# Patient Record
Sex: Female | Born: 1960 | Race: White | Hispanic: No | Marital: Married | Smoking: Never smoker
Health system: Southern US, Community
[De-identification: ages and names within clinical notes are randomized; demographics above are authoritative.]

## PROBLEM LIST (undated history)

## (undated) DIAGNOSIS — G43109 Migraine with aura, not intractable, without status migrainosus: Secondary | ICD-10-CM

## (undated) DIAGNOSIS — G40909 Epilepsy, unspecified, not intractable, without status epilepticus: Secondary | ICD-10-CM

## (undated) HISTORY — DX: Migraine with aura, not intractable, without status migrainosus: G43.109

## (undated) HISTORY — DX: Epilepsy, unspecified, not intractable, without status epilepticus: G40.909

## (undated) HISTORY — PX: WISDOM TOOTH EXTRACTION: SHX21

---

## 2000-02-24 ENCOUNTER — Ambulatory Visit (HOSPITAL_COMMUNITY): Admission: RE | Admit: 2000-02-24 | Discharge: 2000-02-24 | Payer: Self-pay | Admitting: Family Medicine

## 2000-02-24 ENCOUNTER — Encounter: Payer: Self-pay | Admitting: Family Medicine

## 2001-11-08 ENCOUNTER — Other Ambulatory Visit: Admission: RE | Admit: 2001-11-08 | Discharge: 2001-11-08 | Payer: Self-pay | Admitting: Obstetrics and Gynecology

## 2004-03-11 ENCOUNTER — Other Ambulatory Visit: Admission: RE | Admit: 2004-03-11 | Discharge: 2004-03-11 | Payer: Self-pay | Admitting: Obstetrics and Gynecology

## 2005-03-14 ENCOUNTER — Other Ambulatory Visit: Admission: RE | Admit: 2005-03-14 | Discharge: 2005-03-14 | Payer: Self-pay | Admitting: Obstetrics and Gynecology

## 2006-03-30 ENCOUNTER — Other Ambulatory Visit: Admission: RE | Admit: 2006-03-30 | Discharge: 2006-03-30 | Payer: Self-pay | Admitting: Obstetrics & Gynecology

## 2007-04-01 ENCOUNTER — Other Ambulatory Visit: Admission: RE | Admit: 2007-04-01 | Discharge: 2007-04-01 | Payer: Self-pay | Admitting: Obstetrics and Gynecology

## 2008-04-07 ENCOUNTER — Other Ambulatory Visit: Admission: RE | Admit: 2008-04-07 | Discharge: 2008-04-07 | Payer: Self-pay | Admitting: Obstetrics and Gynecology

## 2010-11-15 ENCOUNTER — Other Ambulatory Visit: Payer: Self-pay | Admitting: Family Medicine

## 2010-11-15 DIAGNOSIS — R51 Headache: Secondary | ICD-10-CM

## 2010-11-21 ENCOUNTER — Ambulatory Visit
Admission: RE | Admit: 2010-11-21 | Discharge: 2010-11-21 | Disposition: A | Discharge status: Home / Self Care | Payer: 59 | Source: Ambulatory Visit | Attending: Family Medicine | Admitting: Family Medicine

## 2010-11-21 DIAGNOSIS — R51 Headache: Secondary | ICD-10-CM

## 2010-11-21 MED ORDER — GADOBENATE DIMEGLUMINE 529 MG/ML IV SOLN
11.0000 mL | Freq: Once | INTRAVENOUS | Status: AC | PRN
  Administered 2010-11-21: 11 mL via INTRAVENOUS

## 2012-06-06 LAB — HM PAP SMEAR

## 2012-07-24 LAB — HM MAMMOGRAPHY

## 2013-05-07 ENCOUNTER — Other Ambulatory Visit: Payer: Self-pay | Admitting: Nurse Practitioner

## 2013-05-07 NOTE — Telephone Encounter (Signed)
Annual Exam scheduled 05/2013

## 2013-06-11 ENCOUNTER — Encounter: Payer: Self-pay | Admitting: Nurse Practitioner

## 2013-06-12 ENCOUNTER — Encounter: Payer: Self-pay | Admitting: Nurse Practitioner

## 2013-06-12 ENCOUNTER — Ambulatory Visit (INDEPENDENT_AMBULATORY_CARE_PROVIDER_SITE_OTHER): Payer: Managed Care, Other (non HMO) | Admitting: Nurse Practitioner

## 2013-06-12 VITALS — BP 120/76 | HR 76 | Ht 62.25 in | Wt 124.0 lb

## 2013-06-12 DIAGNOSIS — E559 Vitamin D deficiency, unspecified: Secondary | ICD-10-CM

## 2013-06-12 DIAGNOSIS — Z01419 Encounter for gynecological examination (general) (routine) without abnormal findings: Secondary | ICD-10-CM

## 2013-06-12 DIAGNOSIS — R8290 Unspecified abnormal findings in urine: Secondary | ICD-10-CM

## 2013-06-12 DIAGNOSIS — N912 Amenorrhea, unspecified: Secondary | ICD-10-CM

## 2013-06-12 DIAGNOSIS — R82998 Other abnormal findings in urine: Secondary | ICD-10-CM

## 2013-06-12 DIAGNOSIS — Z0000 Encounter for general adult medical examination without abnormal findings: Secondary | ICD-10-CM

## 2013-06-12 LAB — POCT URINALYSIS DIPSTICK
Bilirubin, UA: NEGATIVE
Glucose, UA: NEGATIVE
Ketones, UA: NEGATIVE
Nitrite, UA: NEGATIVE
Urobilinogen, UA: NEGATIVE
pH, UA: 5

## 2013-06-12 LAB — TSH: TSH: 1.619 u[IU]/mL (ref 0.350–4.500)

## 2013-06-12 LAB — HEMOGLOBIN, FINGERSTICK: Hemoglobin, fingerstick: 13.5 g/dL (ref 12.0–16.0)

## 2013-06-12 MED ORDER — NORETHINDRONE 0.35 MG PO TABS: ORAL_TABLET | ORAL | Status: DC

## 2013-06-12 MED ORDER — ESTRADIOL 10 MCG VA TABS: 1.0000 | ORAL_TABLET | VAGINAL | Status: DC

## 2013-06-12 MED ORDER — VITAMIN D (ERGOCALCIFEROL) 1.25 MG (50000 UNIT) PO CAPS: 50000.0000 [IU] | ORAL_CAPSULE | ORAL | Status: DC

## 2013-06-12 NOTE — Progress Notes (Signed)
Reviewed personally.  M. Suzanne Ruffin Lada, MD.  

## 2013-06-12 NOTE — Progress Notes (Signed)
Patient ID: Betty DiegoLisa P York, female   DOB: 05/18/1960, 53 y.o.   MRN: [id number] 53 y.o. [id number] Married Caucasian Fe here for annual exam.  Amenorrhea X 3 years on POP.  Currently having muscle tension headaches and followed by her neurologist.  No LMP recorded. Patient is not currently having periods (Reason: Oral contraceptives).          Sexually active: yes  The current method of family planning is oral progesterone-only contraceptive.    Exercising: yes  Home exercise routine includes yoga and walking 4-5 days per week.  Yoga 1-2 times per week.. Smoker:  no  Health Maintenance: Pap:  06/06/12, WNL (very atrophic) MMG:  07/24/12, Bi-Rads 2: benign findings, repeat in one year Colonoscopy:  10/04/12, normal, repeat in 10 years BMD:  never TDaP:  05/23/10 Labs:  HB: 13.5  Urine: trace blood, 1+ leuks, trace protein   reports that she has never smoked. She has never used smokeless tobacco. She reports that she does not drink alcohol or use illicit drugs.  Past Medical History  Diagnosis Date  . Seizure disorder     Past Surgical History  Procedure Laterality Date  . Cesarean section  1990  . Wisdom tooth extraction  age 53    Current Outpatient Prescriptions  Medication Sig Dispense Refill  . Calcium Carbonate-Vit D-Min (CALCIUM 1200 PO) Take 1,200 mg by mouth daily.      . cyclobenzaprine (FLEXERIL) 5 MG tablet as directed.      . Estradiol (VAGIFEM) 10 MCG TABS vaginal tablet Place 1 tablet (10 mcg total) vaginally 3 (three) times a week.  24 tablet  3  . norethindrone (MICRONOR,CAMILA,ERRIN) 0.35 MG tablet TAKE 1 TABLET EVERY DAY  84 tablet  3  . predniSONE (DELTASONE) 10 MG tablet as directed.      . Vitamin D, Ergocalciferol, (DRISDOL) 50000 UNITS CAPS capsule Take 1 capsule (50,000 Units total) by mouth every 7 (seven) days.  30 capsule  3   No current facility-administered medications for this visit.    Family History  Problem Relation Age of Onset  . Hypertension Mother    . Hypertension Father     ROS:  Pertinent items are noted in HPI.  Otherwise, a comprehensive ROS was negative.  Exam:   BP 120/76  Pulse 76  Ht 5' 2.25" (1.581 m)  Wt 124 lb (56.246 kg)  BMI 22.50 kg/m2 Height: 5' 2.25" (158.1 cm)  Ht Readings from Last 3 Encounters:  06/12/13 5' 2.25" (1.581 m)    General appearance: alert, cooperative and appears stated age Head: Normocephalic, without obvious abnormality, atraumatic Neck: no adenopathy, supple, symmetrical, trachea midline and thyroid normal to inspection and palpation Lungs: clear to auscultation bilaterally Breasts: normal appearance, no masses or tenderness Heart: regular rate and rhythm Abdomen: soft, non-tender; no masses,  no organomegaly Extremities: extremities normal, atraumatic, no cyanosis or edema Skin: Skin color, texture, turgor normal. No rashes or lesions Lymph nodes: Cervical, supraclavicular, and axillary nodes normal. No abnormal inguinal nodes palpated Neurologic: Grossly normal   Pelvic: External genitalia:  no lesions              Urethra:  normal appearing urethra with no masses, tenderness or lesions              Bartholin's and Skene's: normal                 Vagina: normal appearing vagina with normal color and discharge, no lesions  Cervix: anteverted              Pap taken: no Bimanual Exam:  Uterus:  normal size, contour, position, consistency, mobility, non-tender              Adnexa: no mass, fullness, tenderness               Rectovaginal: Confirms               Anus:  normal sphincter tone, no lesions  A:  Well Woman with normal exam  POP for birth control with amenorrhea  History of atrophic vaginitis  R/O UTI - asymptomatic  P:   Pap smear as per guidelines   Mammogram due 3/15  Will Vit D and will follow with labs   Refill POP but will [facility] with [facility] results  Counseled on breast self exam, mammography screening, adequate intake of calcium and vitamin D, diet and  exercise return annually or prn  An After Visit Summary was printed and given to the patient.

## 2013-06-12 NOTE — Patient Instructions (Signed)

## 2013-06-13 LAB — URINE CULTURE
Colony Count: NO GROWTH
Organism ID, Bacteria: NO GROWTH

## 2013-06-13 LAB — FOLLICLE STIMULATING HORMONE: FSH: 111.9 m[IU]/mL

## 2013-06-13 LAB — VITAMIN D 25 HYDROXY (VIT D DEFICIENCY, FRACTURES): Vit D, 25-Hydroxy: 16 ng/mL — ABNORMAL LOW (ref 30–89)

## 2013-06-16 ENCOUNTER — Ambulatory Visit: Payer: Managed Care, Other (non HMO) | Admitting: Nurse Practitioner

## 2013-09-08 ENCOUNTER — Ambulatory Visit (INDEPENDENT_AMBULATORY_CARE_PROVIDER_SITE_OTHER): Payer: Managed Care, Other (non HMO) | Admitting: *Deleted

## 2013-09-08 DIAGNOSIS — E559 Vitamin D deficiency, unspecified: Secondary | ICD-10-CM

## 2013-09-08 NOTE — Addendum Note (Signed)
Addended by: Luisa DagoPHILLIPS, STEPHANIE C on: 09/08/2013 01:38 PM   Modules accepted: Orders

## 2013-09-09 ENCOUNTER — Other Ambulatory Visit: Payer: Self-pay

## 2013-09-09 LAB — VITAMIN D 25 HYDROXY (VIT D DEFICIENCY, FRACTURES): Vit D, 25-Hydroxy: 33 ng/mL (ref 30–89)

## 2013-09-10 ENCOUNTER — Telehealth: Payer: Self-pay | Admitting: *Deleted

## 2013-09-10 NOTE — Telephone Encounter (Signed)
Patient is calling stephanie back °

## 2013-09-10 NOTE — Telephone Encounter (Signed)
Message copied by Luisa DagoPHILLIPS, STEPHANIE C on Wed Sep 10, 2013 10:58 AM ------      Message from: GRUBB, Elease HashimotoPATRICIA R      Created: Tue Sep 09, 2013  8:41 AM       Good news Vit D has gone from 9316 to 5333 continue with Vit D per protocol. ------

## 2013-09-10 NOTE — Telephone Encounter (Signed)
I have attempted to contact this patient by phone with the following results: left message to return my call on answering machine (mobile per DPR).  

## 2013-09-11 NOTE — Telephone Encounter (Signed)
Pt notified in result note. Med list updated.

## 2013-09-11 NOTE — Telephone Encounter (Signed)
Patient is calling stephanie back °

## 2014-03-16 ENCOUNTER — Encounter: Payer: Self-pay | Admitting: Nurse Practitioner

## 2014-06-18 ENCOUNTER — Ambulatory Visit (INDEPENDENT_AMBULATORY_CARE_PROVIDER_SITE_OTHER): Payer: Managed Care, Other (non HMO) | Admitting: Nurse Practitioner

## 2014-06-18 ENCOUNTER — Encounter: Payer: Self-pay | Admitting: Nurse Practitioner

## 2014-06-18 VITALS — BP 100/66 | HR 64 | Ht 62.25 in | Wt 125.0 lb

## 2014-06-18 DIAGNOSIS — Z0000 Encounter for general adult medical examination without abnormal findings: Secondary | ICD-10-CM

## 2014-06-18 DIAGNOSIS — R8290 Unspecified abnormal findings in urine: Secondary | ICD-10-CM

## 2014-06-18 DIAGNOSIS — M5481 Occipital neuralgia: Secondary | ICD-10-CM

## 2014-06-18 DIAGNOSIS — Z01419 Encounter for gynecological examination (general) (routine) without abnormal findings: Secondary | ICD-10-CM

## 2014-06-18 DIAGNOSIS — E559 Vitamin D deficiency, unspecified: Secondary | ICD-10-CM

## 2014-06-18 LAB — POCT URINALYSIS DIPSTICK
Bilirubin, UA: NEGATIVE
Blood, UA: NEGATIVE
Glucose, UA: NEGATIVE
Ketones, UA: NEGATIVE
Nitrite, UA: NEGATIVE
Protein, UA: NEGATIVE
Urobilinogen, UA: NEGATIVE
pH, UA: 6

## 2014-06-18 MED ORDER — ESTRADIOL 10 MCG VA TABS: 1.0000 | ORAL_TABLET | VAGINAL | Status: DC

## 2014-06-18 NOTE — Patient Instructions (Signed)

## 2014-06-18 NOTE — Progress Notes (Signed)
Patient ID: Betty York, female   DOB: 1961/03/17, 54 y.o.   MRN: [id number] 54 y.o. G9P1021 Married  Caucasian Fe here for annual exam.  She is now off POP since last January with a FSH of 119.2.   No vaginal bleeding or spotting.  On Neurontin for Occipital Neuralgia. She sees neurologist and has had trigger point injections initially.   Still vaginal dryness and still on Vagifem.  Son has moved to [place] and will be getting married in June.  Patient's last menstrual period was 11/12/2008.   Pt was on progestin only pill, now off menopausal.       Sexually active: Yes.    The current method of family planning is post menopausal status.    Exercising: Yes.    yoga and walking Smoker:  no  Health Maintenance: Pap:  06/06/12, negative (very atrophic) MMG:  07/29/13, Bi-Rads 2:  Benign findings  Colonoscopy:  5//3/14, polyp, repeat in 10 years TDaP:  05/23/10 Labs:  HB:  13.4  Urine:  1+ leuk's   reports that she has never smoked. She has never used smokeless tobacco. She reports that she does not drink alcohol or use illicit drugs.  Past Medical History  Diagnosis Date  . Seizure disorder     Past Surgical History  Procedure Laterality Date  . Cesarean section  1990  . Wisdom tooth extraction  age 75    Current Outpatient Prescriptions  Medication Sig Dispense Refill  . Estradiol (VAGIFEM) 10 MCG TABS vaginal tablet Place 1 tablet (10 mcg total) vaginally 3 (three) times a week. 24 tablet 3  . gabapentin (NEURONTIN) 600 MG tablet Take 1.5 tablets by mouth daily.  3   No current facility-administered medications for this visit.    Family History  Problem Relation Age of Onset  . Hypertension Mother   . Hypertension Father     ROS:  Pertinent items are noted in HPI.  Otherwise, a comprehensive ROS was negative.  Exam:   BP 100/66 mmHg  Pulse 64  Ht 5' 2.25" (1.581 m)  Wt 125 lb (56.7 kg)  BMI 22.68 kg/m2  LMP 11/12/2008 Height: 5' 2.25" (158.1 cm) Ht Readings from Last 3  Encounters:  06/18/14 5' 2.25" (1.581 m)  06/12/13 5' 2.25" (1.581 m)    General appearance: alert, cooperative and appears stated age Head: Normocephalic, without obvious abnormality, atraumatic Neck: no adenopathy, supple, symmetrical, trachea midline and thyroid normal to inspection and palpation Lungs: clear to auscultation bilaterally Breasts: normal appearance, no masses or tenderness Heart: regular rate and rhythm Abdomen: soft, non-tender; no masses,  no organomegaly Extremities: extremities normal, atraumatic, no cyanosis or edema Skin: Skin color, texture, turgor normal. No rashes or lesions Lymph nodes: Cervical, supraclavicular, and axillary nodes normal. No abnormal inguinal nodes palpated Neurologic: Grossly normal   Pelvic: External genitalia:  no lesions              Urethra:  normal appearing urethra with no masses, tenderness or lesions              Bartholin's and Skene's: normal                 Vagina: atrophic appearing vagina with normal color and discharge, no lesions              Cervix: anteverted              Pap taken: Yes.   Bimanual Exam:  Uterus:  normal size, contour, position,  consistency, mobility, non-tender              Adnexa: no mass, fullness, tenderness               Rectovaginal: Confirms               Anus:  normal sphincter tone, no lesions  Chaperone present:  no  A:  Well Woman with normal exam  Menopausal History of atrophic vaginitis - better on Vagifem R/O UTI - asymptomatic  Vit d deficiency  History of Occipital Neuralgia   P:   Reviewed health and wellness pertinent to exam  Pap smear taken today  Mammogram is due 07/2014  Refill on Vagifem for a year  Counseled with risk of CVA, DVT, cancer  Recheck Vit D and follow  Counseled on breast self exam, mammography screening, use and side effects of HRT, adequate intake of calcium and vitamin D, diet and exercise return annually or prn  An After Visit  Summary was printed and given to the patient.

## 2014-06-19 LAB — VITAMIN D 25 HYDROXY (VIT D DEFICIENCY, FRACTURES): Vit D, 25-Hydroxy: 15 ng/mL — ABNORMAL LOW (ref 30–100)

## 2014-06-19 LAB — HEMOGLOBIN, FINGERSTICK: Hemoglobin, fingerstick: 13.4 g/dL (ref 12.0–16.0)

## 2014-06-20 LAB — URINE CULTURE: Colony Count: 10000

## 2014-06-21 NOTE — Progress Notes (Signed)
Encounter reviewed by Dr. Mikaele Stecher Silva.  

## 2014-06-22 MED ORDER — VITAMIN D (ERGOCALCIFEROL) 1.25 MG (50000 UNIT) PO CAPS: 50000.0000 [IU] | ORAL_CAPSULE | ORAL | Status: DC

## 2014-06-22 NOTE — Addendum Note (Signed)
Addended by: Luisa DagoPHILLIPS, STEPHANIE C on: 06/22/2014 04:32 PM   Modules accepted: Orders, SmartSet

## 2014-06-26 LAB — IPS PAP TEST WITH HPV

## 2014-09-14 ENCOUNTER — Other Ambulatory Visit: Payer: Self-pay

## 2014-09-22 ENCOUNTER — Other Ambulatory Visit: Payer: Self-pay

## 2014-10-07 ENCOUNTER — Telehealth: Payer: Self-pay | Admitting: *Deleted

## 2014-10-07 NOTE — Telephone Encounter (Signed)
-----   Message from Ria CommentPatricia Grubb, FNP sent at 10/02/2014  8:15 AM EDT ----- Can you call and reschedule apt.  ----- Message -----    From: SYSTEM    Sent: 10/02/2014  12:05 AM      To: Ria CommentPatricia Grubb, FNP

## 2014-10-07 NOTE — Telephone Encounter (Signed)
I have attempted to contact this patient by phone with the following results: left message to return my call on answering machine ([facility]  [phone] (Home) RE:  Pt need lab appt for repeat Vit D.

## 2015-06-22 ENCOUNTER — Ambulatory Visit: Payer: Managed Care, Other (non HMO) | Admitting: Nurse Practitioner

## 2015-07-01 NOTE — Telephone Encounter (Signed)
Pt has AEX 07/23/15.  Will discuss at that time. Closing encounter.

## 2015-07-23 ENCOUNTER — Ambulatory Visit (INDEPENDENT_AMBULATORY_CARE_PROVIDER_SITE_OTHER): Payer: Managed Care, Other (non HMO) | Admitting: Nurse Practitioner

## 2015-07-23 ENCOUNTER — Encounter: Payer: Self-pay | Admitting: Nurse Practitioner

## 2015-07-23 VITALS — BP 108/66 | HR 64 | Ht 62.0 in | Wt 122.0 lb

## 2015-07-23 DIAGNOSIS — N852 Hypertrophy of uterus: Secondary | ICD-10-CM | POA: Diagnosis not present

## 2015-07-23 DIAGNOSIS — E559 Vitamin D deficiency, unspecified: Secondary | ICD-10-CM | POA: Diagnosis not present

## 2015-07-23 DIAGNOSIS — Z0000 Encounter for general adult medical examination without abnormal findings: Secondary | ICD-10-CM

## 2015-07-23 DIAGNOSIS — Z01419 Encounter for gynecological examination (general) (routine) without abnormal findings: Secondary | ICD-10-CM | POA: Diagnosis not present

## 2015-07-23 LAB — VITAMIN D 25 HYDROXY (VIT D DEFICIENCY, FRACTURES): Vit D, 25-Hydroxy: 19 ng/mL — ABNORMAL LOW (ref 30–100)

## 2015-07-23 LAB — HEMOGLOBIN, FINGERSTICK: Hemoglobin, fingerstick: 13.1 g/dL (ref 12.0–16.0)

## 2015-07-23 LAB — POCT URINALYSIS DIPSTICK
Bilirubin, UA: NEGATIVE
Blood, UA: NEGATIVE
Glucose, UA: NEGATIVE
Ketones, UA: NEGATIVE
Leukocytes, UA: NEGATIVE
Nitrite, UA: NEGATIVE
Protein, UA: NEGATIVE
Urobilinogen, UA: NEGATIVE
pH, UA: 7

## 2015-07-23 LAB — HIV ANTIBODY (ROUTINE TESTING W REFLEX): HIV 1&2 Ab, 4th Generation: NONREACTIVE

## 2015-07-23 MED ORDER — ESTRADIOL 10 MCG VA TABS: ORAL_TABLET | VAGINAL | Status: DC

## 2015-07-23 NOTE — Progress Notes (Signed)
Patient ID: Betty York, female   DOB: 09/03/1960, 55 y.o.   MRN: [id number]  [age] y.o. 653P1021 Married  Caucasian Fe here for annual exam.  She is feeling well.  She finds that if she takes Vit D RX the next day will have a migraine HA.  She has been under a lot of stress this past year.  She had shingles top of thigh fall 2016 with residual scars but no herpetic neuralgia.  She takes Gabapentin for other occipital neuralgia.  Patient's last menstrual period was 11/12/2008 (approximate).          Sexually active: Yes.    The current method of family planning is post menopausal status.    Exercising: Yes.    Home exercise routine includes yoga and walking. Smoker:  no  Health Maintenance: Pap: 06/18/14, Negative with neg HR HPV MMG: 10/14/14, Bi-Rads 1: Negative  Colonoscopy:  10/04/12, Dr. Loreta AveMann? Normal repeat in 10 yrs TDaP:  05/23/10 Hep C and HIV: done today Labs: HB: 13.1  Urine: Negative   reports that she has never smoked. She has never used smokeless tobacco. She reports that she does not drink alcohol or use illicit drugs.  Past Medical History  Diagnosis Date  . Seizure disorder [facility](HCC)     Past Surgical History  Procedure Laterality Date  . Cesarean section  1990  . Wisdom tooth extraction  age 55    Current Outpatient Prescriptions  Medication Sig Dispense Refill  . Estradiol (VAGIFEM) 10 MCG TABS vaginal tablet Use 1 tablet nightly X 14, then use three times a week 20 tablet 12  . gabapentin (NEURONTIN) 600 MG tablet Take 1.5 tablets by mouth daily.  3  . Vitamin D, Ergocalciferol, (DRISDOL) 50000 UNITS CAPS capsule Take 1 capsule (50,000 Units total) by mouth every 7 (seven) days. (Patient not taking: Reported on 07/23/2015) 30 capsule 1   No current facility-administered medications for this visit.    Family History  Problem Relation Age of Onset  . Hypertension Mother   . Hypertension Father     ROS:  Pertinent items are noted in HPI.  Otherwise, a comprehensive ROS  was negative.  Exam:   BP 108/66 mmHg  Pulse 64  Ht 5\' 2"  (1.575 m)  Wt 122 lb (55.339 kg)  BMI 22.31 kg/m2  LMP 11/12/2008 (Approximate) Height: 5\' 2"  (157.5 cm) Ht Readings from Last 3 Encounters:  07/23/15 5\' 2"  (1.575 m)  06/18/14 5' 2.25" (1.581 m)  06/12/13 5' 2.25" (1.581 m)    General appearance: alert, cooperative and appears stated age Head: Normocephalic, without obvious abnormality, atraumatic Neck: no adenopathy, supple, symmetrical, trachea midline and thyroid normal to inspection and palpation Lungs: clear to auscultation bilaterally Breasts: normal appearance, no masses or tenderness Heart: regular rate and rhythm Abdomen: soft, non-tender; no masses,  no organomegaly Extremities: extremities normal, atraumatic, no cyanosis or edema Skin: Skin color, texture, turgor normal. No rashes or lesions Lymph nodes: Cervical, supraclavicular, and axillary nodes normal. No abnormal inguinal nodes palpated Neurologic: Grossly normal   Pelvic: External genitalia:  no lesions              Urethra:  normal appearing urethra with no masses, tenderness or lesions              Bartholin's and Skene's: normal                 Vagina:very atrophic appearing vagina with normal color and discharge, no lesions  Cervix: anteverted              Pap taken: No. Bimanual Exam:  Uterus:  normal size, contour, position, consistency, mobility, non-tender and mass possible mass or stool on the right.              Adnexa: positive for: fullness that is most likely stool               Rectovaginal: Confirms               Anus:  normal sphincter tone, no lesions  Chaperone present: no  A:  Well Woman with normal exam  Menopausal History of atrophic vaginitis - currently off Vagifem secondary to cost Vit d deficiency History of Occipital Neuralgia  History of shingles top of thigh fall 2016  ? Uterine enlargement on the right vs stool in the  colon  P:   Reviewed health and wellness pertinent to exam  Pap smear as above  Mammogram is due 10/2015  Refill on Vagifem since now generic at HS X 14 then three times a week.  Counseled on risk of CVA, DVT, cancer,etc  Will follow with labs  Will plan on patient taking a stool softener and recheck again on Monday  Counseled on breast self exam, mammography screening, use and side effects of HRT, adequate intake of calcium and vitamin D, diet and exercise return annually or prn  An After Visit Summary was printed and given to the patient.

## 2015-07-23 NOTE — Patient Instructions (Signed)

## 2015-07-24 LAB — HEPATITIS C ANTIBODY: HCV Ab: NEGATIVE

## 2015-07-25 NOTE — Progress Notes (Signed)
Encounter reviewed by Dr. Brook Amundson C. Silva.  

## 2015-07-26 ENCOUNTER — Ambulatory Visit (INDEPENDENT_AMBULATORY_CARE_PROVIDER_SITE_OTHER): Payer: Managed Care, Other (non HMO) | Admitting: Nurse Practitioner

## 2015-07-26 ENCOUNTER — Encounter: Payer: Self-pay | Admitting: Nurse Practitioner

## 2015-07-26 VITALS — BP 118/70 | HR 72 | Resp 14 | Wt 126.0 lb

## 2015-07-26 DIAGNOSIS — R1900 Intra-abdominal and pelvic swelling, mass and lump, unspecified site: Secondary | ICD-10-CM

## 2015-07-26 NOTE — Progress Notes (Signed)
55 y.o. Married Caucasian female [id number] here for follow up of possible mass right side of uterus vs. Stool in the colon.  This was noted on Friday during her annual exam.  Since then she has taken stool softeners and has had good BM's.  Denies any symptoms of abdominal pain, N/V, or other GI symptoms.    O: Healthy WD,WN female Affect: normal Skin: warm and dry Abdomen:soft, non tender, normal bowel sounds Pelvic exam:EXTERNAL GENITALIA: normal appearing vulva with no masses, tenderness or lesions.  Normal discharge.  Uterus without enlargement or irregularity on the right side as noted on exam last Friday.  No adnexal mass.  A: Pelvic mass Resolved   Most likely stool in the colon    P:  Discussed findings and no other intervention is needed   Labs :   none  Instructions given regarding: AEX in 1 year  RV

## 2015-07-27 NOTE — Progress Notes (Signed)
Encounter reviewed by Dr. Brook Amundson C. Silva.  

## 2015-09-07 ENCOUNTER — Other Ambulatory Visit: Payer: Self-pay | Admitting: *Deleted

## 2015-09-07 MED ORDER — ESTRADIOL 10 MCG VA TABS: ORAL_TABLET | VAGINAL | Status: DC

## 2015-09-07 NOTE — Telephone Encounter (Signed)
Pharmacy sent fax requesting RX to be changed to 90 supply.

## 2015-11-22 ENCOUNTER — Encounter: Payer: Self-pay | Admitting: Nurse Practitioner

## 2016-07-31 ENCOUNTER — Ambulatory Visit: Payer: Managed Care, Other (non HMO) | Admitting: Nurse Practitioner

## 2016-08-01 ENCOUNTER — Ambulatory Visit: Payer: Managed Care, Other (non HMO) | Admitting: Nurse Practitioner

## 2016-08-09 ENCOUNTER — Ambulatory Visit: Payer: Managed Care, Other (non HMO) | Admitting: Nurse Practitioner

## 2016-08-30 ENCOUNTER — Ambulatory Visit: Payer: Managed Care, Other (non HMO) | Admitting: Nurse Practitioner

## 2016-10-06 NOTE — Progress Notes (Signed)
Patient ID: Betty York, female   DOB: 03/13/1961, 56 y.o.   MRN: [id number]  56 y.o. [id number] Married  Caucasian Fe here for annual exam.  postmenopausal and doing well with Vagifem.  She finds that if she takes Vit D RX the next day will have a migraine HA.= Vit D was 19 and 15 past 2 yrs.  She did try the OTC 1000 IU but not taking that now.  Does not recall if that gave her a headache as bad or any at all.  She is at great risk for Osteo given her BMI, estrogen deficiency and Vit d deficiency status.   Patient's last menstrual period was 11/12/2008 (approximate).          Sexually active: Yes.    The current method of family planning is post menopausal status.    Exercising: Yes.    Yoga, Walking Smoker:  no  Health Maintenance: Pap: 06/18/14, Negative with neg HR HPV  06/06/12, Negative History of Abnormal Pap: yes, many years ago. Repeat was normal.  MMG: 11/09/15, 3D-no, Density Category B, BIRADS1, [facility] Self Breast exams: yes Colonoscopy: 10/04/12 Polyp. Repeat 5-10 years BMD[facility] TDaP: 05/23/10 Hep C and HIV: 07/23/15 Neg Labs: Work   reports that she has never smoked. She has never used smokeless tobacco. She reports that she does not drink alcohol or use drugs.  Past Medical History:  Diagnosis Date  . Seizure disorder [facility](HCC)     Past Surgical History:  Procedure Laterality Date  . CESAREAN SECTION  1990  . WISDOM TOOTH EXTRACTION  age 56    Current Outpatient Prescriptions  Medication Sig Dispense Refill  . Estradiol (VAGIFEM) 10 MCG TABS vaginal tablet Use 1 tablet nightly X 14, then use three times a week 36 tablet 3  . gabapentin (NEURONTIN) 600 MG tablet Take 1.5 tablets by mouth daily.  3   No current facility-administered medications for this visit.     Family History  Problem Relation Age of Onset  . Hypertension Mother   . Hypertension Father     ROS:  Pertinent items are noted in HPI.  Otherwise, a comprehensive ROS was  negative.  Exam:   BP 120/76 (BP Location: Right Arm, Patient Position: Sitting, Cuff Size: Normal)   Pulse 80   Resp 16   Ht 5\' 2"  (1.575 m)   Wt 124 lb 3.2 oz (56.3 kg)   LMP 11/12/2008 (Approximate)   BMI 22.72 kg/m  Height: 5\' 2"  (157.5 cm) Ht Readings from Last 3 Encounters:  10/10/16 5\' 2"  (1.575 m)  07/23/15 5\' 2"  (1.575 m)  06/18/14 5' 2.25" (1.581 m)    General appearance: alert, cooperative and appears stated age Head: Normocephalic, without obvious abnormality, atraumatic Neck: no adenopathy, supple, symmetrical, trachea midline and thyroid normal to inspection and palpation Lungs: clear to auscultation bilaterally Breasts: normal appearance, no masses or tenderness Heart: regular rate and rhythm Abdomen: soft, non-tender; no masses,  no organomegaly Extremities: extremities normal, atraumatic, no cyanosis or edema Skin: Skin color, texture, turgor normal. No rashes or lesions Lymph nodes: Cervical, supraclavicular, and axillary nodes normal. No abnormal inguinal nodes palpated Neurologic: Grossly normal   Pelvic: External genitalia:  no lesions              Urethra:  normal appearing urethra with no masses, tenderness or lesions              Bartholin's and Skene's: normal  Vagina: normal appearing vagina with normal color and discharge, no lesions              Cervix: anteverted              Pap taken: No. Bimanual Exam:  Uterus:  normal size, contour, position, consistency, mobility, non-tender              Adnexa: no mass, fullness, tenderness               Rectovaginal: Confirms               Anus:  normal sphincter tone, no lesions  Chaperone present: no  A:  Well Woman with normal exam  Menopausal about age 76 History of atrophic vaginitis - doing better with Vagifem Vit d deficiency History of Occipital Neuralgia                      P:   Reviewed health and wellness pertinent to exam  Pap smear:  no  Refill on Vagifem to use 3 times a week for a year  Counseled about risk of DVT, CVA, cancer, etc.  Mammogram is due 10/2016 and will get BMD at same time -order is faxed to [facility] - she will verify coverage with insurance.  Our plan is to restart OTC Vit D 1000 IU daily - if tolerated over 2 weeks then go to 2000 IU daily.  Will follow with Vit D results  Counseled on breast self exam, mammography screening, use and side effects of HRT, adequate intake of calcium and vitamin D, diet and exercise return annually or prn  An After Visit Summary was printed and given to the patient.

## 2016-10-10 ENCOUNTER — Ambulatory Visit (INDEPENDENT_AMBULATORY_CARE_PROVIDER_SITE_OTHER): Payer: Managed Care, Other (non HMO) | Admitting: Nurse Practitioner

## 2016-10-10 ENCOUNTER — Encounter: Payer: Self-pay | Admitting: Nurse Practitioner

## 2016-10-10 VITALS — BP 120/76 | HR 80 | Resp 16 | Ht 62.0 in | Wt 124.2 lb

## 2016-10-10 DIAGNOSIS — Z01419 Encounter for gynecological examination (general) (routine) without abnormal findings: Secondary | ICD-10-CM

## 2016-10-10 DIAGNOSIS — Z0000 Encounter for general adult medical examination without abnormal findings: Secondary | ICD-10-CM

## 2016-10-10 DIAGNOSIS — E559 Vitamin D deficiency, unspecified: Secondary | ICD-10-CM | POA: Diagnosis not present

## 2016-10-10 MED ORDER — ESTRADIOL 10 MCG VA TABS: ORAL_TABLET | VAGINAL | 4 refills | Status: DC

## 2016-10-10 NOTE — Patient Instructions (Signed)

## 2016-10-10 NOTE — Progress Notes (Signed)
Reviewed personally.  M. Suzanne Annalysia Willenbring, MD.  

## 2016-10-11 ENCOUNTER — Telehealth: Payer: Self-pay | Admitting: *Deleted

## 2016-10-11 LAB — VITAMIN D 25 HYDROXY (VIT D DEFICIENCY, FRACTURES): Vit D, 25-Hydroxy: 22 ng/mL — ABNORMAL LOW (ref 30–100)

## 2016-10-11 NOTE — Telephone Encounter (Signed)
I have attempted to contact this patient by phone with the following results: left message to return call to [place] at [phone] on answering machine (mobile [facility]). Advised call is regarding Vit D results. [phone] (Mobile) *Preferred*

## 2016-10-11 NOTE — Telephone Encounter (Signed)
Pt notified in result note.  Closing encounter. 

## 2016-10-11 NOTE — Telephone Encounter (Signed)
-----   Message from Ria CommentPatricia Grubb, FNP sent at 10/11/2016  7:46 AM EDT ----- Please let pt know that Vit D is low at 22 - have her to try OTC Vit D at 1000 IU daily.  If that gives her a headache then try going to every other day.  Call us if not tolerated at all.

## 2016-10-25 ENCOUNTER — Other Ambulatory Visit: Payer: Self-pay | Admitting: Nurse Practitioner

## 2016-11-29 ENCOUNTER — Telehealth: Payer: Self-pay | Admitting: Obstetrics and Gynecology

## 2016-11-29 NOTE — Telephone Encounter (Signed)
Patient called to check on the status of her results for [facility].  FYI only: Patient rescheduled from Ria CommentPatricia Grubb, FNP's schedule on 10/16/17 to Dr. Gertie ExonJill Jertson on 10/18/17.

## 2016-11-29 NOTE — Telephone Encounter (Signed)
Dr. Oscar LaJertson, have you received and reviewed [facility] BMD results dated 11/16/16?

## 2016-12-01 NOTE — Telephone Encounter (Signed)
Spoke with patient, advised Dr. Oscar LaJertson is out of the office today. Will return call once BMD results reviewed. Patient thankful for f/u and verbalizes understanding.   Forwarding to Dr. Shirley FriarJertson FYI.

## 2016-12-01 NOTE — Telephone Encounter (Signed)
Patient calling again requesting results

## 2016-12-01 NOTE — Telephone Encounter (Signed)
Patient notified of results.  BMD showed Osteopenia. Take OTC calcium and Vit D. F/u 2 years. - per Dr. Oscar LaJertson  BMD report to scan  Encounter closed.

## 2016-12-11 ENCOUNTER — Encounter: Payer: Self-pay | Admitting: Certified Nurse Midwife

## 2017-10-15 NOTE — Progress Notes (Signed)
57 y.o. [id number] MarriedCaucasianF here for annual exam.  No vaginal bleeding. On Vagifem, using it 3 x a week. Still has pain with intercourse, just at entry, using a lubricant. On occasion she may tear.     Patient's last menstrual period was 11/12/2008 (approximate).          Sexually active: Yes.    The current method of family planning is post menopausal status.    Exercising: Yes.    yoga and walking Smoker:  no  Health Maintenance: Pap: 06-18-14 Neg:Neg HR HPV, 06-06-12 Neg History of abnormal Pap:  Yes, many years ago-repeat normal MMG: 11-17-16 3D/Density b/Neg/BiRads1 Colonoscopy:  10/04/12 Polyp. Repeat 5-10 years BMD:  11-16-16 Osteopenia, low T score of -2.0 in the spine. FRAX risks 5.9% and 0.4% TDaP:  05-23-10 Gardasil: no   reports that she has never smoked. She has never used smokeless tobacco. She reports that she drinks about 2.4 oz of alcohol per week. She reports that she does not use drugs. 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 Son is [id number], no grandchildren.   Past Medical History:  Diagnosis Date  . Migraine headache with aura   . Seizure disorder [facility](HCC)     Past Surgical History:  Procedure Laterality Date  . CESAREAN SECTION  1990  . WISDOM TOOTH EXTRACTION  age 57    Current Outpatient Medications  Medication Sig Dispense Refill  . botulinum toxin Type A (BOTOX) 100 units SOLR injection Botox 100 unit injection    . cholecalciferol (VITAMIN D) 1000 units tablet Take 1,000 Units by mouth daily.    . Estradiol (VAGIFEM) 10 MCG TABS vaginal tablet Use 1 tablet nightly three times a week 36 tablet 4  . gabapentin (NEURONTIN) 600 MG tablet Take 1.5 tablets by mouth daily.  3  . Magnesium 500 MG CAPS Take 1 capsule by mouth daily.     No current facility-administered medications for this visit.     Family History  Problem Relation Age of Onset  . Hypertension Mother   . Hypertension Father     Review of Systems  Constitutional: Negative.   HENT: Negative.    Eyes: Negative.   Respiratory: Negative.   Cardiovascular: Negative.   Gastrointestinal: Negative.   Endocrine: Negative.   Genitourinary: Negative.   Musculoskeletal: Negative.   Skin: Negative.   Allergic/Immunologic: Negative.   Neurological:       Migraines  Psychiatric/Behavioral: Negative.     Exam:   BP 100/62 (BP Location: Right Arm, Patient Position: Sitting, Cuff Size: Normal)   Pulse 70   Resp 18   Ht 5\' 2"  (1.575 m)   Wt 123 lb 12.8 oz (56.2 kg)   LMP 11/12/2008 (Approximate)   BMI 22.64 kg/m   Weight change: @WEIGHTCHANGE @ Height:   Height: 5\' 2"  (157.5 cm)  Ht Readings from Last 3 Encounters:  10/18/17 5\' 2"  (1.575 m)  10/10/16 5\' 2"  (1.575 m)  07/23/15 5\' 2"  (1.575 m)    General appearance: alert, cooperative and appears stated age Head: Normocephalic, without obvious abnormality, atraumatic Neck: no adenopathy, supple, symmetrical, trachea midline and thyroid normal to inspection and palpation Lungs: clear to auscultation bilaterally Cardiovascular: regular rate and rhythm Breasts: normal appearance, no masses or tenderness Abdomen: soft, non-tender; non distended,  no masses,  no organomegaly Extremities: extremities normal, atraumatic, no cyanosis or edema Skin: Skin color, texture, turgor normal. No rashes or lesions Lymph nodes: Cervical, supraclavicular, and axillary nodes normal. No abnormal inguinal nodes palpated Neurologic:  Grossly normal   Pelvic: External genitalia:  atrophic              Urethra:  normal appearing urethra with no masses, tenderness or lesions              Bartholins and Skenes: normal                 Vagina: atrophic appearing vagina with normal color and discharge, no lesions. Tight introitus              Cervix: atrophic, stenotic               Bimanual Exam:  Uterus:  normal size, contour, position, consistency, mobility, non-tender              Adnexa: no mass, fullness, tenderness               Rectovaginal:  Confirms               Anus:  normal sphincter tone, no lesions, some perianal whitening, looks c/w lichen simplex chronicus (patient denies symptoms)  Chaperone was present for exam.  A:  Well Woman with normal exam  Vit D def  Vulvovaginal atrophy  Dyspareunia  Osteopenia  P:   Labs at work, other than vit D (will check today)  Pap with hpv  Stop vagifem and start estrace cream, use topically as well as internally  Discussed the option of topical lidocaine or using vaginal dilators  Use lubrication with intercourse, she should control the rate and depth of penetration  Will check on when she is due for a colonoscopy and get back to her.  Mammogram  Discussed breast self exam  Discussed calcium and vit D intake  F/U DEXA next year

## 2017-10-16 ENCOUNTER — Ambulatory Visit: Payer: Managed Care, Other (non HMO) | Admitting: Nurse Practitioner

## 2017-10-18 ENCOUNTER — Ambulatory Visit (INDEPENDENT_AMBULATORY_CARE_PROVIDER_SITE_OTHER): Payer: 59 | Admitting: Obstetrics and Gynecology

## 2017-10-18 ENCOUNTER — Other Ambulatory Visit: Payer: Self-pay

## 2017-10-18 ENCOUNTER — Encounter: Payer: Self-pay | Admitting: Obstetrics and Gynecology

## 2017-10-18 ENCOUNTER — Other Ambulatory Visit (HOSPITAL_COMMUNITY)
Admission: RE | Admit: 2017-10-18 | Discharge: 2017-10-18 | Disposition: A | Discharge status: Home / Self Care | Payer: 59 | Source: Ambulatory Visit | Attending: Obstetrics and Gynecology | Admitting: Obstetrics and Gynecology

## 2017-10-18 VITALS — BP 100/62 | HR 70 | Resp 18 | Ht 62.0 in | Wt 123.8 lb

## 2017-10-18 DIAGNOSIS — N905 Atrophy of vulva: Secondary | ICD-10-CM | POA: Diagnosis not present

## 2017-10-18 DIAGNOSIS — E559 Vitamin D deficiency, unspecified: Secondary | ICD-10-CM | POA: Diagnosis not present

## 2017-10-18 DIAGNOSIS — N941 Unspecified dyspareunia: Secondary | ICD-10-CM

## 2017-10-18 DIAGNOSIS — N952 Postmenopausal atrophic vaginitis: Secondary | ICD-10-CM | POA: Diagnosis not present

## 2017-10-18 DIAGNOSIS — M8580 Other specified disorders of bone density and structure, unspecified site: Secondary | ICD-10-CM

## 2017-10-18 DIAGNOSIS — Z124 Encounter for screening for malignant neoplasm of cervix: Secondary | ICD-10-CM | POA: Insufficient documentation

## 2017-10-18 DIAGNOSIS — Z01419 Encounter for gynecological examination (general) (routine) without abnormal findings: Secondary | ICD-10-CM | POA: Diagnosis not present

## 2017-10-18 MED ORDER — ESTRADIOL 0.1 MG/GM VA CREA: TOPICAL_CREAM | VAGINAL | 2 refills | Status: DC

## 2017-10-18 NOTE — Patient Instructions (Signed)
EXERCISE AND DIET:  We recommended that you start or continue a regular exercise program for good health. Regular exercise means any activity that makes your heart beat faster and makes you sweat.  We recommend exercising at least 30 minutes per day at least 3 days a week, preferably 4 or 5.  We also recommend a diet low in fat and sugar.  Inactivity, poor dietary choices and obesity can cause diabetes, heart attack, stroke, and kidney damage, among others.    ALCOHOL AND SMOKING:  Women should limit their alcohol intake to no more than 7 drinks/beers/glasses of wine (combined, not each!) per week. Moderation of alcohol intake to this level decreases your risk of breast cancer and liver damage. And of course, no recreational drugs are part of a healthy lifestyle.  And absolutely no smoking or even second hand smoke. Most people know smoking can cause heart and lung diseases, but did you know it also contributes to weakening of your bones? Aging of your skin?  Yellowing of your teeth and nails?  CALCIUM AND VITAMIN D:  Adequate intake of calcium and Vitamin D are recommended.  The recommendations for exact amounts of these supplements seem to change often, but generally speaking 600 mg of calcium (either carbonate or citrate) and 800 units of Vitamin D per day seems prudent. Certain women may benefit from higher intake of Vitamin D.  If you are among these women, your doctor will have told you during your visit.    PAP SMEARS:  Pap smears, to check for cervical cancer or precancers,  have traditionally been done yearly, although recent scientific advances have shown that most women can have pap smears less often.  However, every woman still should have a physical exam from her gynecologist every year. It will include a breast check, inspection of the vulva and vagina to check for abnormal growths or skin changes, a visual exam of the cervix, and then an exam to evaluate the size and shape of the uterus and  ovaries.  And after 56 years of age, a rectal exam is indicated to check for rectal cancers. We will also provide age appropriate advice regarding health maintenance, like when you should have certain vaccines, screening for sexually transmitted diseases, bone density testing, colonoscopy, mammograms, etc.   MAMMOGRAMS:  All women over 40 years old should have a yearly mammogram. Many facilities now offer a "3D" mammogram, which may cost around $50 extra out of pocket. If possible,  we recommend you accept the option to have the 3D mammogram performed.  It both reduces the number of women who will be called back for extra views which then turn out to be normal, and it is better than the routine mammogram at detecting truly abnormal areas.    COLONOSCOPY:  Colonoscopy to screen for colon cancer is recommended for all women at age 50.  We know, you hate the idea of the prep.  We agree, BUT, having colon cancer and not knowing it is worse!!  Colon cancer so often starts as a polyp that can be seen and removed at colonscopy, which can quite literally save your life!  And if your first colonoscopy is normal and you have no family history of colon cancer, most women don't have to have it again for 10 years.  Once every ten years, you can do something that may end up saving your life, right?  We will be happy to help you get it scheduled when you are ready.    Be sure to check your insurance coverage so you understand how much it will cost.  It may be covered as a preventative service at no cost, but you should check your particular policy.     Atrophic Vaginitis Atrophic vaginitis is a condition in which the tissues that line the vagina become dry and thin. This condition is most common in women who have stopped having regular menstrual periods (menopause). This usually starts when a woman is 73-45 years old. Estrogen helps to keep the vagina moist. It stimulates the vagina to produce a clear fluid that lubricates  the vagina for sexual intercourse. This fluid also protects the vagina from infection. Lack of estrogen can cause the lining of the vagina to get thinner and dryer. The vagina may also shrink in size. It may become less elastic. Atrophic vaginitis tends to get worse over time as a woman's estrogen level drops. What are the causes? This condition is caused by the normal drop in estrogen that happens around the time of menopause. What increases the risk? Certain conditions or situations may lower a woman's estrogen level, which increases her risk of atrophic vaginitis. These include:  Taking medicine that blocks estrogen.  Having ovaries removed surgically.  Being treated for cancer with X-ray treatment (radiation) or medicines (chemotherapy).  Exercising very hard and often.  Having an eating disorder (anorexia).  Giving birth or breastfeeding.  Being over the age of 42.  Smoking.  What are the signs or symptoms? Symptoms of this condition include:  Pain, soreness, or bleeding during sexual intercourse (dyspareunia).  Vaginal burning, irritation, or itching.  Pain or bleeding during a vaginal examination using a speculum (pelvic exam).  Loss of interest in sexual activity.  Having burning pain when passing urine.  Vaginal discharge that is brown or yellow.  In some cases, there are no symptoms. How is this diagnosed? This condition is diagnosed with a medical history and physical exam. This will include a pelvic exam that checks whether the inside of your vagina appears pale, thin, or dry. Rarely, you may also have other tests, including:  A urine test.  A test that checks the acid balance in your vaginal fluid (acid balance test).  How is this treated? Treatment for this condition may depend on the severity of your symptoms. Treatment may include:  Using an over-the-counter vaginal lubricant before you have sexual intercourse.  Using a long-acting vaginal  moisturizer.  Using low-dose vaginal estrogen for moderate to severe symptoms that do not respond to other treatments. Options include creams, tablets, and inserts (vaginal rings). Before using vaginal estrogen, tell your health care provider if you have a history of: ? Breast cancer. ? Endometrial cancer. ? Blood clots.  Taking medicines. You may be able to take a daily pill for dyspareunia. Discuss all of the risks of this medicine with your health care provider. It is usually not recommended for women who have a family history or personal history of breast cancer.  If your symptoms are very mild and you are not sexually active, you may not need treatment. Follow these instructions at home:  Take medicines only as directed by your health care provider. Do not use herbal or alternative medicines unless your health care provider says that you can.  Use over-the-counter creams, lubricants, or moisturizers for dryness only as directed by your health care provider.  If your atrophic vaginitis is caused by menopause, discuss all of your menopausal symptoms and treatment options with your health care  provider.  Do not douche.  Do not use products that can make your vagina dry. These include: ? Scented feminine sprays. ? Scented tampons. ? Scented soaps.  If it hurts to have sex, talk with your sexual partner. Contact a health care provider if:  Your discharge looks different than normal.  Your vagina has an unusual smell.  You have new symptoms.  Your symptoms do not improve with treatment.  Your symptoms get worse. This information is not intended to replace advice given to you by your health care provider. Make sure you discuss any questions you have with your health care provider. Document Released: 09/15/2014 Document Revised: 10/07/2015 Document Reviewed: 04/22/2014 Elsevier Interactive Patient Education  2018 [employer].  Breast Self-Awareness Breast self-awareness means  being familiar with how your breasts look and feel. It involves checking your breasts regularly and reporting any changes to your health care provider. Practicing breast self-awareness is important. A change in your breasts can be a sign of a serious medical problem. Being familiar with how your breasts look and feel allows you to find any problems early, when treatment is more likely to be successful. All women should practice breast self-awareness, including women who have had breast implants. How to do a breast self-exam One way to learn what is normal for your breasts and whether your breasts are changing is to do a breast self-exam. To do a breast self-exam: Look for Changes  1. Remove all the clothing above your waist. 2. Stand in front of a mirror in a room with good lighting. 3. Put your hands on your hips. 4. Push your hands firmly downward. 5. Compare your breasts in the mirror. Look for differences between them (asymmetry), such as: ? Differences in shape. ? Differences in size. ? Puckers, dips, and bumps in one breast and not the other. 6. Look at each breast for changes in your skin, such as: ? Redness. ? Scaly areas. 7. Look for changes in your nipples, such as: ? Discharge. ? Bleeding. ? Dimpling. ? Redness. ? A change in position. Feel for Changes  Carefully feel your breasts for lumps and changes. It is best to do this while lying on your back on the floor and again while sitting or standing in the shower or tub with soapy water on your skin. Feel each breast in the following way:  Place the arm on the side of the breast you are examining above your head.  Feel your breast with the other hand.  Start in the nipple area and make  inch (2 cm) overlapping circles to feel your breast. Use the pads of your three middle fingers to do this. Apply light pressure, then medium pressure, then firm pressure. The light pressure will allow you to feel the tissue closest to the  skin. The medium pressure will allow you to feel the tissue that is a little deeper. The firm pressure will allow you to feel the tissue close to the ribs.  Continue the overlapping circles, moving downward over the breast until you feel your ribs below your breast.  Move one finger-width toward the center of the body. Continue to use the  inch (2 cm) overlapping circles to feel your breast as you move slowly up toward your collarbone.  Continue the up and down exam using all three pressures until you reach your armpit.  Write Down What You Find  Write down what is normal for each breast and any changes that you find. Keep  a written record with breast changes or normal findings for each breast. By writing this information down, you do not need to depend only on memory for size, tenderness, or location. Write down where you are in your menstrual cycle, if you are still menstruating. If you are having trouble noticing differences in your breasts, do not get discouraged. With time you will become more familiar with the variations in your breasts and more comfortable with the exam. How often should I examine my breasts? Examine your breasts every month. If you are breastfeeding, the best time to examine your breasts is after a feeding or after using a breast pump. If you menstruate, the best time to examine your breasts is 5-7 days after your period is over. During your period, your breasts are lumpier, and it may be more difficult to notice changes. When should I see my health care provider? See your health care provider if you notice:  A change in shape or size of your breasts or nipples.  A change in the skin of your breast or nipples, such as a reddened or scaly area.  Unusual discharge from your nipples.  A lump or thick area that was not there before.  Pain in your breasts.  Anything that concerns you.  This information is not intended to replace advice given to you by your health  care provider. Make sure you discuss any questions you have with your health care provider. Document Released: 05/01/2005 Document Revised: 10/07/2015 Document Reviewed: 03/21/2015 Elsevier Interactive Patient Education  [employer]2018 Elsevier Inc.

## 2017-10-19 LAB — VITAMIN D 25 HYDROXY (VIT D DEFICIENCY, FRACTURES): Vit D, 25-Hydroxy: 33.4 ng/mL (ref 30.0–100.0)

## 2017-10-22 LAB — CYTOLOGY - PAP
Diagnosis: NEGATIVE
HPV: NOT DETECTED

## 2017-11-01 ENCOUNTER — Telehealth: Payer: Self-pay | Admitting: *Deleted

## 2017-11-01 NOTE — Telephone Encounter (Signed)
Spoke with Dayton ScrapeJaleesa at Dr. Kenna GilbertMann's office. Next colonoscopy due May 2024. [facility] 10/04/12 [facility]

## 2017-11-01 NOTE — Telephone Encounter (Signed)
-----   Message from Betty BolkJill Evelyn Jertson, MD sent at 10/18/2017  9:03 AM EDT ----- Dr Kenna GilbertMann's colonoscopy note says to f/u in 5-10 years. Can you please check with her office on the path report and as to when she should have a colonoscopy. Please let her know. Thanks, Noreene LarssonJill

## 2017-11-01 NOTE — Telephone Encounter (Signed)
Spoke with patient, advised of next colonoscopy date as seen below.   Patient states she has been experiencing lower back pain, has osteopenia, discussed this some at last AEX 10/18/17. Patient states she has a form she would like Dr. Oscar LaJertson to review and complete if possible for a standing desk to use at her work. Patient to fax form to [phone] for Dr. Oscar LaJertson to review. Advised I will return call to advise if can be completed. Patient agreeable.

## 2017-11-01 NOTE — Telephone Encounter (Signed)
10/04/12 pathology report received and to Dr. Oscar LaJertson for review.

## 2017-11-21 NOTE — Telephone Encounter (Signed)
Form received in office, placed on Dr. Salli QuarryJertson's desk for review.

## 2017-11-26 NOTE — Telephone Encounter (Signed)
Form reviewed by Dr. Reyne DumasJerston, call returned to patient. 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 Advised patient Dr. Oscar LaJertson unable to complete form, can not make determination of how much patient can safely lift. Recommended patient f/u with PCP for further assistance with form. Patient verbalizes understanding and is agreeable.  Routing to provider for final review. Patient is agreeable to disposition. Will close encounter.

## 2017-12-14 ENCOUNTER — Other Ambulatory Visit: Payer: Self-pay | Admitting: Obstetrics and Gynecology

## 2017-12-14 NOTE — Telephone Encounter (Signed)
Medication refill request: Estrace .1mg   Last AEX:  10/18/17 Next AEX: 11/06/18  Last MMG (if hormonal medication request): 11/16/16 Birads category 1 neg  Refill authorized: Please refill if appropriate.

## 2017-12-18 ENCOUNTER — Encounter: Payer: Self-pay | Admitting: Obstetrics and Gynecology

## 2018-01-04 ENCOUNTER — Other Ambulatory Visit: Payer: Self-pay | Admitting: *Deleted

## 2018-01-04 NOTE — Telephone Encounter (Signed)
Incoming fax request received from patient's pharmacy for yuvafem. RX refused for change in therapy-- patient now using estrace cream per aex note from Dr. Oscar LaJertson on 10-18-17.   Encounter closed.

## 2018-11-04 NOTE — Progress Notes (Signed)
58 y.o. [id number] Married White or Caucasian Not Hispanic or Latino female here for annual exam.   No vaginal bleeding. Using [place]. Still entry dyspareunia, helps some.     Patient's last menstrual period was 11/12/2008 (approximate).          Sexually active: Yes.    The current method of family planning is post menopausal status.    Exercising: Yes.    yoga, walking Smoker:  no  Health Maintenance: Pap: 10/18/2017 WNL NEG HPV, 06-18-14 Neg:Neg HR HPV History of abnormal Pap:  Yes, many years ago-repeat normal MMG: 7/8/219 Birads 1 negative Colonoscopy: 10/04/12 Polyp. Repeat 5-10 years BMD:  11-16-16 Osteopenia, low T score of -2.0 in the spine. FRAX risks 5.9% and 0.4% TDaP:  05-23-10 Gardasil: no   reports that she has never smoked. She has never used smokeless tobacco. She reports current alcohol use of about 4.0 standard drinks of alcohol per week. She reports that she does not use drugs. 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 One son, no grandchildren.   Past Medical History:  Diagnosis Date  . Migraine headache with aura   . Seizure disorder [facility](HCC)     Past Surgical History:  Procedure Laterality Date  . CESAREAN SECTION  1990  . WISDOM TOOTH EXTRACTION  age 58    Current Outpatient Medications  Medication Sig Dispense Refill  . botulinum toxin Type A (BOTOX) 100 units SOLR injection Botox 100 unit injection    . calcium citrate-vitamin D (CITRACAL+D) 315-200 MG-UNIT tablet Take 1 tablet by mouth 2 (two) times daily.    . cholecalciferol (VITAMIN D) 1000 units tablet Take 1,000 Units by mouth daily.    . Estradiol (YUVAFEM) 10 MCG TABS vaginal tablet Place 1 tablet vaginally 2 (two) times a week.    . gabapentin (NEURONTIN) 600 MG tablet Take 1.5 tablets by mouth daily.  3  . Magnesium 500 MG CAPS Take 1 capsule by mouth daily.     No current facility-administered medications for this visit.     Family History  Problem Relation Age of Onset  . Hypertension Mother    . Hypertension Father     Review of Systems  Constitutional: Negative.   HENT: Negative.   Eyes: Negative.   Respiratory: Negative.   Cardiovascular: Negative.   Gastrointestinal: Negative.   Endocrine: Negative.   Genitourinary:       Vaginal dryness  Musculoskeletal: Positive for back pain.  Skin: Negative.   Allergic/Immunologic: Negative.   Neurological: Negative.   Hematological: Negative.   Psychiatric/Behavioral: Negative.     Exam:   BP 104/62 (BP Location: Right Arm, Patient Position: Sitting, Cuff Size: Normal)   Pulse 72   Temp 98.7 F (37.1 C) (Skin)   Ht 5' 2.21" (1.58 m)   Wt 128 lb 3.2 oz (58.2 kg)   LMP 11/12/2008 (Approximate)   BMI 23.29 kg/m   Weight change: @WEIGHTCHANGE @ Height:   Height: 5' 2.21" (158 cm)  Ht Readings from Last 3 Encounters:  11/06/18 5' 2.21" (1.58 m)  10/18/17 5\' 2"  (1.575 m)  10/10/16 5\' 2"  (1.575 m)    General appearance: alert, cooperative and appears stated age Head: Normocephalic, without obvious abnormality, atraumatic Neck: no adenopathy, supple, symmetrical, trachea midline and thyroid normal to inspection and palpation Lungs: clear to auscultation bilaterally Cardiovascular: regular rate and rhythm Breasts: normal appearance, no masses or tenderness Abdomen: soft, non-tender; non distended,  no masses,  no organomegaly Extremities: extremities normal, atraumatic, no cyanosis or  edema Skin: Skin color, texture, turgor normal. No rashes or lesions Lymph nodes: Cervical, supraclavicular, and axillary nodes normal. No abnormal inguinal nodes palpated Neurologic: Grossly normal   Pelvic: External genitalia:  no lesions, + erythema              Urethra:  normal appearing urethra with no masses, tenderness or lesions              Bartholins and Skenes: normal                 Vagina: normal appearing vagina with normal color and discharge, no lesions              Cervix: no lesions               Bimanual Exam:   Uterus:  normal size, contour, position, consistency, mobility, non-tender              Adnexa: no mass, fullness, tenderness               Rectovaginal: Confirms               Anus:  normal sphincter tone, no lesions, perianal whitening and erythema  Chaperone was present for exam.  A:  Well Woman exam  Vaginal atrophy  Dyspareunia  Subacute vulvitis (c/o feeling raw)  Perianal dermatitis  P:   No pap this year  Labs at work  Mammogram and DEXA due next month  Continue vaginal estrogen  Lidocaine ointment for prn use  Use a lubricant, discussed vaginal dilators  Discussed breast self exam  Discussed calcium and vit D intake  Steroid ointment for prn use  Affirm sent  Vulvar skin care reviewed

## 2018-11-06 ENCOUNTER — Other Ambulatory Visit: Payer: Self-pay

## 2018-11-06 ENCOUNTER — Encounter: Payer: Self-pay | Admitting: Obstetrics and Gynecology

## 2018-11-06 ENCOUNTER — Ambulatory Visit (INDEPENDENT_AMBULATORY_CARE_PROVIDER_SITE_OTHER): Payer: 59 | Admitting: Obstetrics and Gynecology

## 2018-11-06 VITALS — BP 104/62 | HR 72 | Temp 98.7°F | Ht 62.21 in | Wt 128.2 lb

## 2018-11-06 DIAGNOSIS — N763 Subacute and chronic vulvitis: Secondary | ICD-10-CM

## 2018-11-06 DIAGNOSIS — N941 Unspecified dyspareunia: Secondary | ICD-10-CM

## 2018-11-06 DIAGNOSIS — Z01419 Encounter for gynecological examination (general) (routine) without abnormal findings: Secondary | ICD-10-CM | POA: Diagnosis not present

## 2018-11-06 DIAGNOSIS — M8580 Other specified disorders of bone density and structure, unspecified site: Secondary | ICD-10-CM

## 2018-11-06 DIAGNOSIS — N952 Postmenopausal atrophic vaginitis: Secondary | ICD-10-CM

## 2018-11-06 DIAGNOSIS — L309 Dermatitis, unspecified: Secondary | ICD-10-CM

## 2018-11-06 MED ORDER — ESTRADIOL 10 MCG VA TABS: 1.0000 | ORAL_TABLET | VAGINAL | 3 refills | Status: DC

## 2018-11-06 MED ORDER — LIDOCAINE 5 % EX OINT: 1.0000 "application " | TOPICAL_OINTMENT | Freq: Three times a day (TID) | CUTANEOUS | 0 refills | Status: DC

## 2018-11-06 MED ORDER — BETAMETHASONE VALERATE 0.1 % EX OINT: 1.0000 "application " | TOPICAL_OINTMENT | Freq: Two times a day (BID) | CUTANEOUS | 0 refills | Status: DC

## 2018-11-06 NOTE — Patient Instructions (Signed)

## 2018-11-07 LAB — VAGINITIS/VAGINOSIS, DNA PROBE
Candida Species: NEGATIVE
Gardnerella vaginalis: NEGATIVE
Trichomonas vaginosis: NEGATIVE

## 2018-12-06 ENCOUNTER — Encounter: Payer: Self-pay | Admitting: Obstetrics and Gynecology

## 2018-12-23 ENCOUNTER — Telehealth: Payer: Self-pay | Admitting: Obstetrics and Gynecology

## 2018-12-23 NOTE — Telephone Encounter (Signed)
Left message to call Sharee Pimple, RN at [place] [facility].    Rx sent for estradiol (yuvafem) 10 mcg vag tab on 11/06/18

## 2018-12-23 NOTE — Telephone Encounter (Signed)
Patient would like to change her prescription back to [facility]. [facility] , [place], [place].

## 2018-12-23 NOTE — Telephone Encounter (Signed)
Spoke with patient, advised Rx sent on 11/06/18. Patient states she received a steroid ointment no estradiol. Advised patient to f/u with pharmacy, RX can be filled as estradiol, vagifem or yuvafem depending on what plan covers. Return call to office if any further assistance needed. Patient verbalizes understanding and is agreeable.   Encounter closed.

## 2018-12-25 ENCOUNTER — Telehealth: Payer: Self-pay | Admitting: Obstetrics and Gynecology

## 2018-12-25 NOTE — Telephone Encounter (Signed)
Spoke with patient. Results given. Patient verbalizes understanding. Encounter closed. 

## 2018-12-25 NOTE — Telephone Encounter (Signed)
Please inform the patient that her DEXA returned with fairly stable osteopenia. Her risk of any fracture in the next 10 years is 6.8% and of a hip fracture is 0.5%. She should continue her calcium and vit d supplements and exercise regularly. She needs another DEXA in 2 years.

## 2019-03-27 ENCOUNTER — Other Ambulatory Visit: Payer: Self-pay | Admitting: Obstetrics and Gynecology

## 2019-03-27 NOTE — Telephone Encounter (Signed)
Med refill request: Valisone Last AEX:11/06/2018 Next AEX: 11/20/19 Last MMG (if hormonal med)  12/06/2018 Refill authorized: 30g, 0RF, orders pended if approved.

## 2019-08-06 ENCOUNTER — Encounter: Payer: Self-pay | Admitting: Certified Nurse Midwife

## 2019-11-20 ENCOUNTER — Ambulatory Visit: Payer: 59 | Admitting: Obstetrics and Gynecology

## 2020-02-26 ENCOUNTER — Ambulatory Visit: Payer: No Typology Code available for payment source | Admitting: Obstetrics and Gynecology

## 2020-03-02 ENCOUNTER — Ambulatory Visit: Payer: No Typology Code available for payment source | Admitting: Obstetrics and Gynecology

## 2020-05-04 NOTE — Progress Notes (Signed)
59 y.o. [id number] Married White or Caucasian Not Hispanic or Latino female here for annual exam.  No vaginal bleeding. She used to be on vaginal estrogen tablets, seemed to worsen her migraines. She is occasionally sexually active, some discomfort, helped some with lubricant.  No bowel or bladder c/o.     Patient's last menstrual period was 11/12/2008 (approximate).          Sexually active: Yes.    The current method of family planning is post menopausal status.    Exercising: Yes.    Walking  Smoker:  no  Health Maintenance: Pap: 10/18/2017 WNL NEG HPV, 06-18-14 Neg:Neg HR HPV  History of abnormal Pap:  Yes many years ago. Repeat was normal.  MMG: 11/2019 WNL report requested from [facility].  BMD:  12/06/18 low bone mass, t score -2.0, frax 6.8/0.5% Colonoscopy: 8 years ago  TDaP:  05/2010 Gardasil: na   reports that she has never smoked. She has never used smokeless tobacco. She reports current alcohol use of about 4.0 standard drinks of alcohol per week. She reports that she does not use drugs. 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 One son, his girlfriend is pregnant with a baby boy, due in 3/22.   Past Medical History:  Diagnosis Date  . Migraine headache with aura   . Seizure disorder [facility])     Past Surgical History:  Procedure Laterality Date  . CESAREAN SECTION  1990  . WISDOM TOOTH EXTRACTION  age 32    Current Outpatient Medications  Medication Sig Dispense Refill  . botulinum toxin Type A (BOTOX) 100 units SOLR injection Botox 100 unit injection    . cholecalciferol (VITAMIN D) 1000 units tablet Take 1,000 Units by mouth daily.    Marland Kitchen gabapentin (NEURONTIN) 600 MG tablet Take 1.5 tablets by mouth daily.  3  . Multiple Vitamins-Minerals (HAIR SKIN AND NAILS FORMULA) TABS Take by mouth.     No current facility-administered medications for this visit.    Family History  Problem Relation Age of Onset  . Hypertension Mother   . Hypertension Father     Review of  Systems  All other systems reviewed and are negative.   Exam:   BP 132/66   Pulse 86   Ht 5\' 2"  (1.575 m)   Wt 127 lb (57.6 kg)   LMP 11/12/2008 (Approximate)   SpO2 100%   BMI 23.23 kg/m   Weight change: @WEIGHTCHANGE @ Height:   Height: 5\' 2"  (157.5 cm)  Ht Readings from Last 3 Encounters:  05/05/20 5\' 2"  (1.575 m)  11/06/18 5' 2.21" (1.58 m)  10/18/17 5\' 2"  (1.575 m)    General appearance: alert, cooperative and appears stated age Head: Normocephalic, without obvious abnormality, atraumatic Neck: no adenopathy, supple, symmetrical, trachea midline and thyroid normal to inspection and palpation Lungs: clear to auscultation bilaterally Cardiovascular: regular rate and rhythm Breasts: normal appearance, no masses or tenderness Abdomen: soft, non-tender; non distended,  no masses,  no organomegaly Extremities: extremities normal, atraumatic, no cyanosis or edema Skin: Skin color, texture, turgor normal. No rashes or lesions Lymph nodes: Cervical, supraclavicular, and axillary nodes normal. No abnormal inguinal nodes palpated Neurologic: Grossly normal   Pelvic: External genitalia:  no lesions              Urethra:  normal appearing urethra with no masses, tenderness or lesions              Bartholins and Skenes: normal  Vagina: atrophic appearing vagina with normal color and discharge, no lesions              Cervix: no lesions               Bimanual Exam:  Uterus:  normal size, contour, position, consistency, mobility, non-tender              Adnexa: no mass, fullness, tenderness               Rectovaginal: Confirms               Anus:  normal sphincter tone, no lesions. Mild perianal dermatitis (patient feels from wiping, not itchy).   Zenovia Jordan chaperoned for the exam.  A:  Well Woman with normal exam  Osteopenia  Vaginal atrophy, previously had migraines with vaginal tablets.   Perianal dermatitis  P:   No pap this year  DEXA in  7/22  Discussed breast self exam  Discussed calcium and vit D intake  Try the estring  Colonoscopy at 61  Mammogram in 7/22  Discussed Vaseline or over the counter steroid for perianal dermatitis.

## 2020-05-05 ENCOUNTER — Other Ambulatory Visit: Payer: Self-pay

## 2020-05-05 ENCOUNTER — Ambulatory Visit (INDEPENDENT_AMBULATORY_CARE_PROVIDER_SITE_OTHER): Payer: No Typology Code available for payment source | Admitting: Obstetrics and Gynecology

## 2020-05-05 ENCOUNTER — Encounter: Payer: Self-pay | Admitting: Obstetrics and Gynecology

## 2020-05-05 VITALS — BP 132/66 | HR 86 | Ht 62.0 in | Wt 127.0 lb

## 2020-05-05 DIAGNOSIS — N941 Unspecified dyspareunia: Secondary | ICD-10-CM | POA: Diagnosis not present

## 2020-05-05 DIAGNOSIS — M8580 Other specified disorders of bone density and structure, unspecified site: Secondary | ICD-10-CM

## 2020-05-05 DIAGNOSIS — E559 Vitamin D deficiency, unspecified: Secondary | ICD-10-CM | POA: Insufficient documentation

## 2020-05-05 DIAGNOSIS — M129 Arthropathy, unspecified: Secondary | ICD-10-CM | POA: Insufficient documentation

## 2020-05-05 DIAGNOSIS — N952 Postmenopausal atrophic vaginitis: Secondary | ICD-10-CM | POA: Diagnosis not present

## 2020-05-05 DIAGNOSIS — Z01419 Encounter for gynecological examination (general) (routine) without abnormal findings: Secondary | ICD-10-CM | POA: Diagnosis not present

## 2020-05-05 DIAGNOSIS — M545 Low back pain, unspecified: Secondary | ICD-10-CM | POA: Insufficient documentation

## 2020-05-05 DIAGNOSIS — K644 Residual hemorrhoidal skin tags: Secondary | ICD-10-CM | POA: Insufficient documentation

## 2020-05-05 DIAGNOSIS — Z8639 Personal history of other endocrine, nutritional and metabolic disease: Secondary | ICD-10-CM

## 2020-05-05 MED ORDER — ESTRADIOL 2 MG VA RING: 2.0000 mg | VAGINAL_RING | VAGINAL | 4 refills | Status: DC

## 2020-05-05 NOTE — Patient Instructions (Signed)

## 2020-05-06 LAB — VITAMIN D 25 HYDROXY (VIT D DEFICIENCY, FRACTURES): Vit D, 25-Hydroxy: 21.9 ng/mL — ABNORMAL LOW (ref 30.0–100.0)

## 2020-08-19 ENCOUNTER — Ambulatory Visit (INDEPENDENT_AMBULATORY_CARE_PROVIDER_SITE_OTHER): Payer: No Typology Code available for payment source | Admitting: Obstetrics and Gynecology

## 2020-08-19 ENCOUNTER — Encounter: Payer: Self-pay | Admitting: Obstetrics and Gynecology

## 2020-08-19 ENCOUNTER — Other Ambulatory Visit: Payer: Self-pay

## 2020-08-19 VITALS — BP 122/64 | HR 66 | Ht 62.0 in | Wt 129.0 lb

## 2020-08-19 DIAGNOSIS — N762 Acute vulvitis: Secondary | ICD-10-CM | POA: Diagnosis not present

## 2020-08-19 DIAGNOSIS — N952 Postmenopausal atrophic vaginitis: Secondary | ICD-10-CM | POA: Diagnosis not present

## 2020-08-19 LAB — WET PREP FOR TRICH, YEAST, CLUE

## 2020-08-19 MED ORDER — BETAMETHASONE VALERATE 0.1 % EX OINT: 1.0000 "application " | TOPICAL_OINTMENT | Freq: Two times a day (BID) | CUTANEOUS | 0 refills | Status: DC

## 2020-08-19 NOTE — Patient Instructions (Signed)
Vaginitis  Vaginitis is a condition in which the vaginal tissue swells and becomes irritated. This condition is most often caused by a change in the normal balance of bacteria and yeast that live in the vagina. This change causes an overgrowth of certain bacteria or yeast, which causes the inflammation. There are different types of vaginitis. What are the causes? The cause of this condition depends on the type of vaginitis. It can be caused by:  Bacteria (bacterial vaginosis).  Yeast, which is a fungus (candidiasis).  A parasite (trichomoniasis vaginitis).  A virus (viral vaginitis).  Low hormone levels (atrophic vaginitis). Low hormone levels can occur during pregnancy, breastfeeding, or after menopause.  Irritants, such as bubble baths, scented tampons, and feminine sprays (allergic vaginitis). Other factors can change the normal balance of the yeast and bacteria that live in the vagina. These include:  Antibiotic medicines.  Poor hygiene.  Diaphragms, vaginal sponges, spermicides, birth control pills, and intrauterine devices (IUDs).  Sex.  Infection.  Uncontrolled diabetes.  A weakened body defense system (immune system). What increases the risk? This condition is more likely to develop in women who:  Smoke or are exposed to secondhand smoke.  Use vaginal douches, scented tampons, or scented sanitary pads.  Wear tight-fitting pants or thong underwear.  Use oral birth control pills or an IUD.  Have sex without a condom or have multiple partners.  Have an STI.  Frequently use the spermicide nonoxynol-9.  Eat lots of foods high in sugar or who have uncontrolled diabetes.  Have low estrogen levels.  Have a weakened immune system from an immune disorder or medical treatment.  Are pregnant or breastfeeding. What are the signs or symptoms? Symptoms vary depending on the cause of the vaginitis. Common symptoms include:  Abnormal vaginal discharge. ? The  discharge is white, gray, or yellow with bacterial vaginosis. ? The discharge is thick, white, and cheesy with a yeast infection. ? The discharge is frothy and yellow or greenish with trichomoniasis.  A bad vaginal smell. The smell is fishy with bacterial vaginosis.  Vaginal itching, pain, or swelling.  Pain with sex.  Pain or burning when urinating. Sometimes there are no symptoms. How is this diagnosed? This condition is diagnosed based on your symptoms and medical history. A physical exam, including a pelvic exam, will also be done. You may also have other tests, including:  Tests to determine the pH level (acidity or alkalinity) of your vagina.  A whiff test to assess the odor that results when a sample of your vaginal discharge is mixed with a potassium hydroxide solution.  Tests of vaginal fluid. A sample will be examined under a microscope. How is this treated? Treatment varies depending on the type of vaginitis you have. Your treatment may include:  Antibiotic creams or pills to treat bacterial vaginosis and trichomoniasis.  Antifungal medicines, such as vaginal creams or suppositories, to treat a yeast infection.  Medicine to ease discomfort if you have viral vaginitis. Your sexual partner should also be treated.  Estrogen delivered in a cream, pill, suppository, or vaginal ring to treat atrophic vaginitis. If vaginal dryness occurs, lubricants and moisturizing creams may help. You may need to avoid scented soaps, sprays, or douches.  Stopping use of a product that is causing allergic vaginitis and then using a vaginal cream to treat the symptoms. Follow these instructions at home: Lifestyle  Keep your genital area clean and dry. Avoid soap, and only rinse the area with water.  Do not douche   or use tampons until your health care provider says it is okay. Use sanitary pads, if needed.  Do not have sex until your health care provider approves. When you can return to sex,  practice safe sex and use condoms.  Wipe from front to back. This avoids the spread of bacteria from the rectum to the vagina. General instructions  Take over-the-counter and prescription medicines only as told by your health care provider.  If you were prescribed an antibiotic medicine, take or use it as told by your health care provider. Do not stop taking or using the antibiotic even if you start to feel better.  Keep all follow-up visits. This is important. How is this prevented?  Use mild, unscented products. Do not use things that can irritate the vagina, such as fabric softeners. Avoid the following products if they are scented: ? Feminine sprays. ? Detergents. ? Tampons. ? Feminine hygiene products. ? Soaps or bubble baths.  Let air reach your genital area. To do this: ? Wear cotton underwear to reduce moisture buildup. ? Avoid wearing underwear while you sleep. ? Avoid wearing tight pants and underwear or nylons without a cotton panel. ? Avoid wearing thong underwear.  Take off any wet clothing, such as bathing suits, as soon as possible.  Practice safe sex and use condoms. Contact a health care provider if:  You have abdominal or pelvic pain.  You have a fever or chills.  You have symptoms that last for more than 2-3 days. Get help right away if:  You have a fever and your symptoms suddenly get worse. Summary  Vaginitis is a condition in which the vaginal tissue becomes inflamed.This condition is most often caused by a change in the normal balance of bacteria and yeast that live in the vagina.  Treatment varies depending on the type of vaginitis you have.  Do not douche, use tampons, or have sex until your health care provider approves. When you can return to sex, practice safe sex and use condoms. This information is not intended to replace advice given to you by your health care provider. Make sure you discuss any questions you have with your health care  provider. Document Revised: 10/30/2019 Document Reviewed: 10/30/2019 Elsevier Patient Education  2021 Elsevier Inc.  

## 2020-08-19 NOTE — Progress Notes (Signed)
GYNECOLOGY  VISIT   HPI: 60 y.o.   Married White or Caucasian Not Hispanic or Latino  female   [phone] with Patient's last menstrual period was 11/12/2008 (approximate).   here for vaginal itching. She denies odor and discharge.  She has had progressively worsening itching over the last several weeks. Tried Vaseline and Vagisil, not helping. The itching is severe and generalized.   GYNECOLOGIC HISTORY: Patient's last menstrual period was 11/12/2008 (approximate). Contraception: none  Menopausal hormone therapy: none         OB History    Gravida  3   Para  1   Term  1   Preterm  0   AB  2   Living  1     SAB  1   IAB  1   Ectopic  0   Multiple  0   Live Births  1              Patient Active Problem List   Diagnosis Date Noted  . Arthropathy, unspecified 05/05/2020  . External hemorrhoids without complication 05/05/2020  . Low back pain 05/05/2020  . Vitamin D deficiency 05/05/2020    Past Medical History:  Diagnosis Date  . Migraine headache with aura   . Seizure disorder [facility])     Past Surgical History:  Procedure Laterality Date  . CESAREAN SECTION  1990  . WISDOM TOOTH EXTRACTION  age 24    Current Outpatient Medications  Medication Sig Dispense Refill  . botulinum toxin Type A (BOTOX) 100 units SOLR injection Botox 100 unit injection    . cholecalciferol (VITAMIN D) 1000 units tablet Take 1,000 Units by mouth daily.    Marland Kitchen gabapentin (NEURONTIN) 600 MG tablet Take 1.5 tablets by mouth daily.  3  . Multiple Vitamins-Minerals (HAIR SKIN AND NAILS FORMULA) TABS Take by mouth.     No current facility-administered medications for this visit.     ALLERGIES: Codeine  Family History  Problem Relation Age of Onset  . Hypertension Mother   . Hypertension Father     Social History   Socioeconomic History  . Marital status: Married    Spouse name: Not on file  . Number of children: Not on file  . Years of education: Not on file  . Highest  education level: Not on file  Occupational History  . Not on file  Tobacco Use  . Smoking status: Never Smoker  . Smokeless tobacco: Never Used  Vaping Use  . Vaping Use: Never used  Substance and Sexual Activity  . Alcohol use: Yes    Alcohol/week: 4.0 standard drinks    Types: 4 Glasses of wine per week  . Drug use: No  . Sexual activity: Yes    Partners: Male    Birth control/protection: Post-menopausal  Other Topics Concern  . Not on file  Social History Narrative  . Not on file   Social Determinants of Health   Financial Resource Strain: Not on file  Food Insecurity: Not on file  Transportation Needs: Not on file  Physical Activity: Not on file  Stress: Not on file  Social Connections: Not on file  Intimate Partner Violence: Not on file    Review of Systems  All other systems reviewed and are negative.   PHYSICAL EXAMINATION:    BP 122/64   Pulse 66   Ht 5\' 2"  (1.575 m)   Wt 129 lb (58.5 kg)   LMP 11/12/2008 (Approximate)   SpO2 99%  BMI 23.59 kg/m     General appearance: alert, cooperative and appears stated age   Pelvic: External genitalia:  no lesions, marked, generalized erythema              Urethra:  normal appearing urethra with no masses, tenderness or lesions              Bartholins and Skenes: normal                 Vagina: very atrophic appearing vagina, no discharge, no lesions              Cervix: no lesions                Chaperone was present for exam.   1. Acute vulvitis - WET PREP FOR TRICH, YEAST, CLUE: negative  - Candidiasis, PCR - betamethasone valerate ointment (VALISONE) 0.1 %; Apply 1 application topically 2 (two) times daily.  Dispense: 30 g; Refill: 0 -discussed vulvar skin care, information given  2. Vaginal atrophy She was given a script for estring, decided not to get it.

## 2020-08-25 LAB — CANDIDIASIS, PCR
C. albicans, DNA: NOT DETECTED
C. glabrata, DNA: NOT DETECTED
C. parapsilosis, DNA: NOT DETECTED
C. tropicalis, DNA: NOT DETECTED

## 2020-11-16 ENCOUNTER — Other Ambulatory Visit: Payer: Self-pay | Admitting: Family Medicine

## 2020-11-16 DIAGNOSIS — R29898 Other symptoms and signs involving the musculoskeletal system: Secondary | ICD-10-CM

## 2020-11-16 DIAGNOSIS — M542 Cervicalgia: Secondary | ICD-10-CM

## 2020-11-17 ENCOUNTER — Ambulatory Visit
Admission: RE | Admit: 2020-11-17 | Discharge: 2020-11-17 | Disposition: A | Discharge status: Home / Self Care | Payer: 59 | Source: Ambulatory Visit | Attending: Family Medicine | Admitting: Family Medicine

## 2020-11-17 ENCOUNTER — Other Ambulatory Visit: Payer: Self-pay

## 2020-11-17 DIAGNOSIS — M542 Cervicalgia: Secondary | ICD-10-CM

## 2020-11-17 DIAGNOSIS — R29898 Other symptoms and signs involving the musculoskeletal system: Secondary | ICD-10-CM

## 2020-11-17 IMAGING — CT CT NECK W/ CM
3 of 4 series · 7 of 14 positions shown, 8 images · IV contrast (iopamidol)
Comparison: None.

CLINICAL DATA: Anterior neck pain with tightness beginning
[DATE]

EXAM:
CT NECK WITH CONTRAST
TECHNIQUE: Multidetector CT imaging of the neck was performed using the
standard protocol following the bolus administration of intravenous
contrast.
CONTRAST:  75mL [PX] IOPAMIDOL ([PX]) INJECTION 61%

[Series 3: neck · axial · 0.40mm/px · z∈[-228,-108]mm · 3 of 122 slices shown]
[im 31/122  bone]
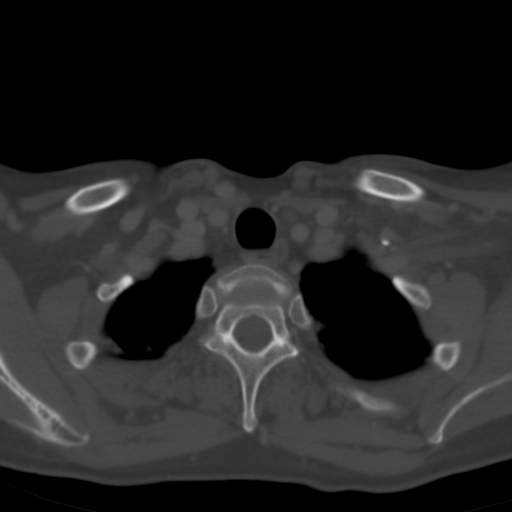
[im 61/122  bone]
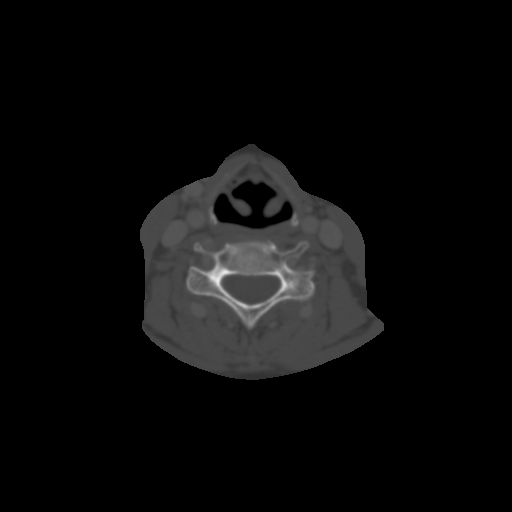
[im 91/122  bone]
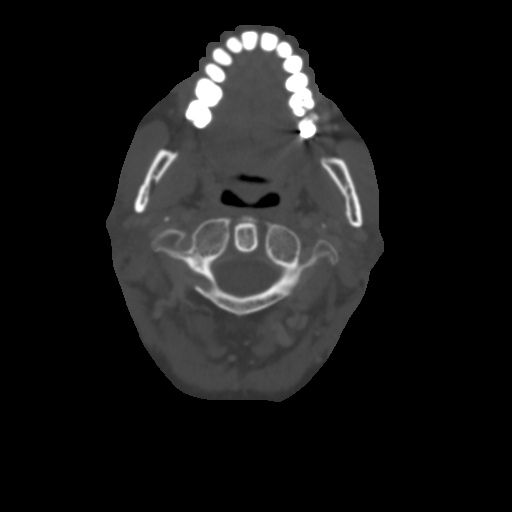

[Series 8: angled axial-oropharynx · axial · 0.39mm/px · z∈[-241,-164]mm · 2 of 123 slices shown, 3 images]
[im 41/123  soft-tissue]
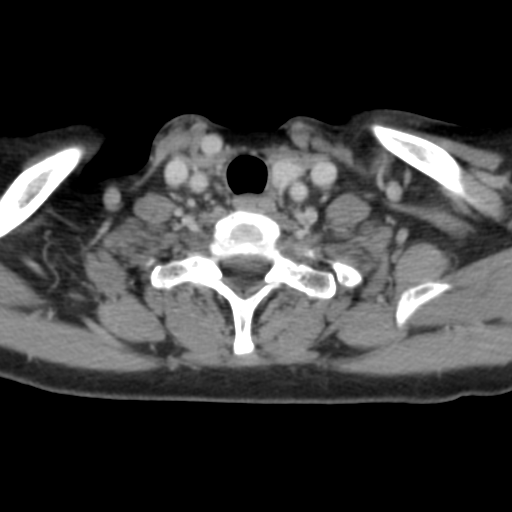
[im 41/123  bone]
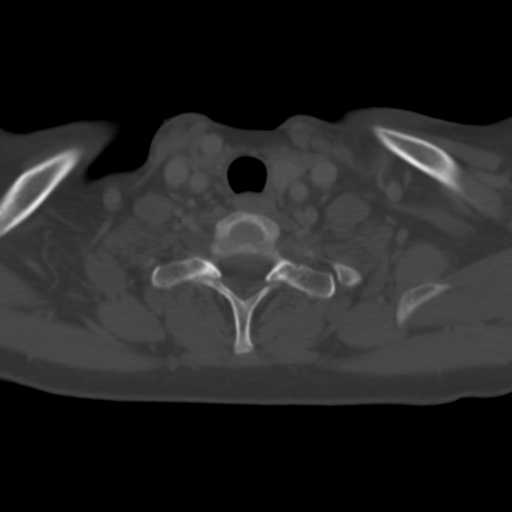
[im 82/123  bone]
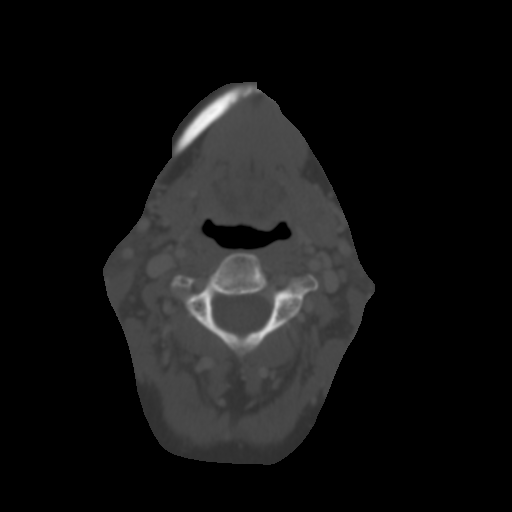

[Series 9: angled (person_name) · axial · 0.33mm/px · z∈[-228,-148]mm · 2 of 123 slices shown]
[im 41/123  bone]
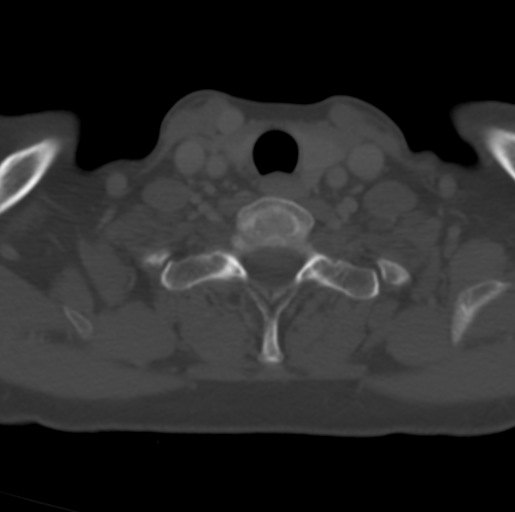
[im 82/123  bone]
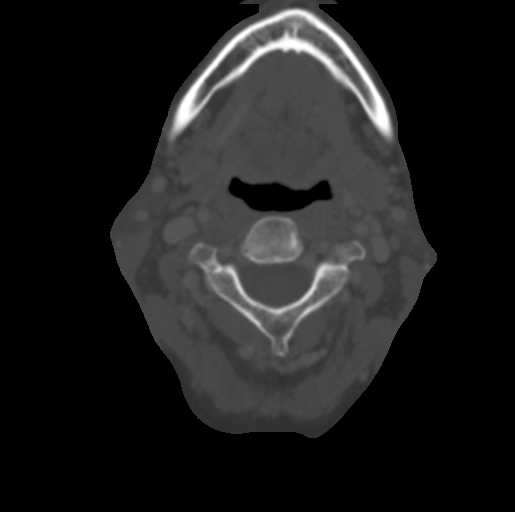

[7 of 14 positions shown; findings below may reference images not displayed]

FINDINGS: Pharynx and larynx: No mucosal or submucosal lesion.

Salivary glands: Parotid and submandibular glands are normal.

Thyroid: Normal

Lymph nodes: No adenopathy.

Vascular: No abnormal vascular finding.

Limited intracranial: Normal

Visualized orbits: Normal

Mastoids and visualized paranasal sinuses: Clear

Skeleton: Normal

Upper chest: Minimal pleural and parenchymal scarring at the apices.

Other: None
IMPRESSION: Normal examination. No abnormality seen to explain the clinical
presentation.

## 2020-11-17 MED ORDER — IOPAMIDOL (ISOVUE-300) INJECTION 61%
75.0000 mL | Freq: Once | INTRAVENOUS | Status: AC | PRN
  Administered 2020-11-17: 75 mL via INTRAVENOUS

## 2021-01-18 ENCOUNTER — Encounter: Payer: Self-pay | Admitting: Obstetrics and Gynecology

## 2021-02-10 ENCOUNTER — Telehealth: Payer: Self-pay | Admitting: *Deleted

## 2021-02-10 NOTE — Telephone Encounter (Signed)
Patient called requesting Dexa results from [facility], they have been scanned in epic.  Please advise

## 2021-02-10 NOTE — Telephone Encounter (Signed)
Please let the patient know that there has been some improvement in her bone density of her left hip. It was otherwise stable. She should continue to get calcium 1,200 mg a day and at least 800 IU of vit d a day. She can get these through her diet or with supplements. She should exercise regularly and have a f/u DEXA in 2 years.

## 2021-02-14 NOTE — Telephone Encounter (Signed)
Patient informed. 

## 2021-02-23 ENCOUNTER — Encounter: Payer: Self-pay | Admitting: Obstetrics and Gynecology

## 2021-05-26 NOTE — Progress Notes (Signed)
61 y.o. [id number] Married White or Caucasian Not Hispanic or Latino female here for annual exam.  She c/o vaginal dryness with intercourse, minimal help with lubrication. Previously had worsening migraines with vaginal estrogen tablets (used them for a few months).  No baseline discomfort.     Patient's last menstrual period was 11/12/2008 (approximate).          Sexually active: Yes.    The current method of family planning is post menopausal status.    Exercising: Yes.     Yoga and walking  Smoker:  no  Health Maintenance: Pap:  10/18/2017 WNL NEG HPV, 06-18-14 Neg:Neg HR HPV  History of abnormal Pap:  yes many years ago. The repeat was normal  MMG:  01/14/21 Bi-rads 1 neg  BMD:   01/18/21 T score -1.5 improved in left hip, otherwise stable. FRAX 7.4/0.7 Colonoscopy: 2014, f/u 10 years  TDaP:  05/2010  Gardasil: none   reports that she has never smoked. She has never used smokeless tobacco. She reports current alcohol use of about 4.0 standard drinks per week. 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 Grown son, one grandson, will be one in 3/23.  Past Medical History:  Diagnosis Date   Migraine headache with aura    Seizure disorder [facility])     Past Surgical History:  Procedure Laterality Date   CESAREAN SECTION  1990   WISDOM TOOTH EXTRACTION  age 30    Current Outpatient Medications  Medication Sig Dispense Refill   botulinum toxin Type A (BOTOX) 100 units SOLR injection Botox 100 unit injection     cholecalciferol (VITAMIN D) 1000 units tablet Take 1,000 Units by mouth daily.     gabapentin (NEURONTIN) 600 MG tablet Take 1.5 tablets by mouth daily.  3   Multiple Vitamins-Minerals (HAIR SKIN AND NAILS FORMULA) TABS Take by mouth.     No current facility-administered medications for this visit.    Family History  Problem Relation Age of Onset   Hypertension Mother    Hypertension Father     Review of Systems  All other systems reviewed  and are negative.  Exam:   BP 120/80    Pulse 68    Ht 5\' 2"  (1.575 m)    Wt 129 lb (58.5 kg)    LMP 11/12/2008 (Approximate)    SpO2 99%    BMI 23.59 kg/m   Weight change: @WEIGHTCHANGE @ Height:   Height: 5\' 2"  (157.5 cm)  Ht Readings from Last 3 Encounters:  06/01/21 5\' 2"  (1.575 m)  08/19/20 5\' 2"  (1.575 m)  05/05/20 5\' 2"  (1.575 m)    General appearance: alert, cooperative and appears stated age Head: Normocephalic, without obvious abnormality, atraumatic Neck: no adenopathy, supple, symmetrical, trachea midline and thyroid normal to inspection and palpation Lungs: clear to auscultation bilaterally Cardiovascular: regular rate and rhythm Breasts: normal appearance, no masses or tenderness Abdomen: soft, non-tender; non distended,  no masses,  no organomegaly Extremities: extremities normal, atraumatic, no cyanosis or edema Skin: Skin color, texture, turgor normal. No rashes or lesions Lymph nodes: Cervical, supraclavicular, and axillary nodes normal. No abnormal inguinal nodes palpated Neurologic: Grossly normal   Pelvic: External genitalia:  no lesions, marked erythema on the inner vulva and perianal region              Urethra:  normal appearing urethra with no masses, tenderness or lesions  Bartholins and Skenes: normal                 Vagina: atrophic appearing vagina, no discharge, no lesions              Cervix: no lesions               Bimanual Exam:  Uterus:  normal size, contour, position, consistency, mobility, non-tender              Adnexa: no mass, fullness, tenderness               Rectovaginal: Confirms               Anus:  normal sphincter tone, no lesions  Carolynn Serve chaperoned for the exam.  On questioning the patient reports vulvar and perianal irritation and itching for the last day  1. Well woman exam Discussed breast self exam Discussed calcium and vit D intake No pap this year Mammogram UTD Colonoscopy next year  2. Vaginal  atrophy Had migraines with vagifem previously - estradiol (ESTRING) 2 MG vaginal ring; Place 2 mg vaginally every 3 (three) months. Insert a new ring into vagina every 3 months  Dispense: 1 each; Refill: 4 -If the estring is too expensive, consider the lower dose vaginal tablets.   3. Dyspareunia, female Discussed lubrication & vaginal estrogen She should control rate and depth of penetration.   4. Laboratory exam ordered as part of routine general medical examination Not fasting (had coffee with cream) - CBC - Comprehensive metabolic panel - Lipid panel  5. Osteopenia, unspecified location DEXA in 2024 Getting calcium and vit d  6. History of vitamin D deficiency - VITAMIN D 25 Hydroxy (Vit-D Deficiency, Fractures)  7. Acute vulvitis -Discussed vulvar skin care, information given - WET PREP FOR TRICH, YEAST, CLUE: negative - betamethasone valerate ointment (VALISONE) 0.1 %; Apply 1 application topically 2 (two) times daily.  Dispense: 30 g; Refill: 0

## 2021-06-01 ENCOUNTER — Other Ambulatory Visit: Payer: Self-pay

## 2021-06-01 ENCOUNTER — Ambulatory Visit (INDEPENDENT_AMBULATORY_CARE_PROVIDER_SITE_OTHER): Payer: No Typology Code available for payment source | Admitting: Obstetrics and Gynecology

## 2021-06-01 ENCOUNTER — Encounter: Payer: Self-pay | Admitting: Obstetrics and Gynecology

## 2021-06-01 ENCOUNTER — Other Ambulatory Visit: Payer: Self-pay | Admitting: Obstetrics and Gynecology

## 2021-06-01 VITALS — BP 120/80 | HR 68 | Ht 62.0 in | Wt 129.0 lb

## 2021-06-01 DIAGNOSIS — N952 Postmenopausal atrophic vaginitis: Secondary | ICD-10-CM

## 2021-06-01 DIAGNOSIS — N941 Unspecified dyspareunia: Secondary | ICD-10-CM | POA: Diagnosis not present

## 2021-06-01 DIAGNOSIS — Z8639 Personal history of other endocrine, nutritional and metabolic disease: Secondary | ICD-10-CM

## 2021-06-01 DIAGNOSIS — Z0000 Encounter for general adult medical examination without abnormal findings: Secondary | ICD-10-CM

## 2021-06-01 DIAGNOSIS — Z01419 Encounter for gynecological examination (general) (routine) without abnormal findings: Secondary | ICD-10-CM

## 2021-06-01 DIAGNOSIS — N762 Acute vulvitis: Secondary | ICD-10-CM

## 2021-06-01 DIAGNOSIS — M8580 Other specified disorders of bone density and structure, unspecified site: Secondary | ICD-10-CM

## 2021-06-01 LAB — CBC
HCT: 42 % (ref 35.0–45.0)
Hemoglobin: 14.1 g/dL (ref 11.7–15.5)
MCH: 30.7 pg (ref 27.0–33.0)
MCHC: 33.6 g/dL (ref 32.0–36.0)
MCV: 91.3 fL (ref 80.0–100.0)
MPV: 11.5 fL (ref 7.5–12.5)
Platelets: 314 10*3/uL (ref 140–400)
RBC: 4.6 10*6/uL (ref 3.80–5.10)
RDW: 12.2 % (ref 11.0–15.0)
WBC: 5.3 10*3/uL (ref 3.8–10.8)

## 2021-06-01 LAB — COMPREHENSIVE METABOLIC PANEL
AG Ratio: 1.5 (calc) (ref 1.0–2.5)
ALT: 23 U/L (ref 6–29)
AST: 22 U/L (ref 10–35)
Albumin: 4.4 g/dL (ref 3.6–5.1)
Alkaline phosphatase (APISO): 67 U/L (ref 37–153)
BUN: 12 mg/dL (ref 7–25)
CO2: 31 mmol/L (ref 20–32)
Calcium: 9.9 mg/dL (ref 8.6–10.4)
Chloride: 103 mmol/L (ref 98–110)
Creat: 0.71 mg/dL (ref 0.50–1.03)
Globulin: 3 g/dL (calc) (ref 1.9–3.7)
Glucose, Bld: 87 mg/dL (ref 65–99)
Potassium: 4.3 mmol/L (ref 3.5–5.3)
Sodium: 138 mmol/L (ref 135–146)
Total Bilirubin: 0.9 mg/dL (ref 0.2–1.2)
Total Protein: 7.4 g/dL (ref 6.1–8.1)

## 2021-06-01 LAB — LIPID PANEL
Cholesterol: 179 mg/dL (ref <200)
HDL: 68 mg/dL (ref >=50)
LDL Cholesterol (Calc): 92 mg/dL (calc)
Non-HDL Cholesterol (Calc): 111 mg/dL (calc) (ref <130)
Total CHOL/HDL Ratio: 2.6 (calc) (ref <5.0)
Triglycerides: 95 mg/dL (ref <150)

## 2021-06-01 LAB — WET PREP FOR TRICH, YEAST, CLUE

## 2021-06-01 LAB — VITAMIN D 25 HYDROXY (VIT D DEFICIENCY, FRACTURES): Vit D, 25-Hydroxy: 22 ng/mL — ABNORMAL LOW (ref 30–100)

## 2021-06-01 MED ORDER — ESTRADIOL 2 MG VA RING: 2.0000 mg | VAGINAL_RING | VAGINAL | 4 refills | Status: DC

## 2021-06-01 MED ORDER — BETAMETHASONE VALERATE 0.1 % EX OINT: 1.0000 "application " | TOPICAL_OINTMENT | Freq: Two times a day (BID) | CUTANEOUS | 0 refills | Status: DC

## 2021-06-01 NOTE — Patient Instructions (Signed)
Try uberlube for vaginal lubrication.   Atrophic Vaginitis Atrophic vaginitis is a condition in which the tissues that line the vagina become dry and thin. This condition is most common in women who have stopped having regular menstrual periods (are in menopause). This usually starts when a woman is 45 to 60 years old. That is the time when a woman's estrogen levels begin to decrease. Estrogen is a female hormone. It helps to keep the tissues of the vagina moist. It stimulates the vagina to produce a clear fluid that lubricates the vagina for sex. This fluid also protects the vagina from infection. Lack of estrogen can cause the lining of the vagina to get thinner and dryer. The vagina may also shrink in size. It may become less elastic. Atrophic vaginitis tends to get worse over time as a woman's estrogen level drops. What are the causes? This condition is caused by the normal drop in estrogen that happens around the time of menopause. What increases the risk? Certain conditions or situations may lower a woman's estrogen level, leading to a higher risk for atrophic vaginitis. You are more likely to develop this condition if: You are taking medicines that block estrogen. You have had your ovaries removed. You are being treated for cancer with radiation or medicines (chemotherapy). You have given birth or are breastfeeding. You are older than age 50. You smoke. What are the signs or symptoms? Symptoms of this condition include: Pain, soreness, a feeling of pressure, or bleeding during sex (dyspareunia). Vaginal burning, irritation, or itching. Pain or bleeding when a speculum is used in a vaginal exam. Having burning pain while urinating. Vaginal discharge. In some cases, there are no symptoms. How is this diagnosed? This condition is diagnosed based on your medical history and a physical exam. This will include a pelvic exam that checks the vaginal tissues. Though rare, you may also have  other tests, including: A urine test. A test that checks the acid balance in your vagina (acid balance test). How is this treated? Treatment for this condition depends on how severe your symptoms are. Treatment may include: Using an over-the-counter vaginal lubricant before sex. Using a long-acting vaginal moisturizer. Using low-dose estrogen for moderate to severe symptoms that do not respond to other treatments. Options include creams, tablets, and inserts (vaginal rings). Before you use a vaginal estrogen, tell your health care provider if you have a history of: Breast cancer. Endometrial cancer. Blood clots. If you are not sexually active and your symptoms are very mild, you may not need treatment. Follow these instructions at home: Medicines Take over-the-counter and prescription medicines only as told by your health care provider. Do not use herbal or alternative medicines unless your health care provider says that you can. Use over-the-counter creams, lubricants, or moisturizers for dryness only as told by your health care provider. General instructions If your atrophic vaginitis is caused by menopause, discuss all of your menopause symptoms and treatment options with your health care provider. Do not douche. Do not use products that can make your vagina dry. These include: Scented feminine sprays. Scented tampons. Scented soaps. Vaginal sex can help to improve blood flow and elasticity of vaginal tissue. If you choose to have sex and it hurts, try using a water-soluble lubricant or moisturizer right before having sex. Contact a health care provider if: Your discharge looks different than normal. Your vagina has an unusual smell. You have new symptoms. Your symptoms do not improve with treatment. Your symptoms get worse.   Summary Atrophic vaginitis is a condition in which the tissues that line the vagina become dry and thin. It is most common in women who have stopped having  regular menstrual periods (are in menopause). Treatment options include using vaginal lubricants and low-dose vaginal estrogen. Contact a health care provider if your vagina has an unusual smell, or if your symptoms get worse or do not improve after treatment. This information is not intended to replace advice given to you by your health care provider. Make sure you discuss any questions you have with your health care provider. Document Revised: 10/30/2019 Document Reviewed: 10/30/2019 Elsevier Patient Education  2022 [employer] Inc.  EXERCISE   We recommended that you start or continue a regular exercise program for good health. Physical activity is anything that gets your body moving, some is better than none. The CDC recommends 150 minutes per week of Moderate-Intensity Aerobic Activity and 2 or more days of Muscle Strengthening Activity.  Benefits of exercise are limitless: helps weight loss/weight maintenance, improves mood and energy, helps with depression and anxiety, improves sleep, tones and strengthens muscles, improves balance, improves bone density, protects from chronic conditions such as heart disease, high blood pressure and diabetes and so much more. To learn more visit: https://www.cdc.gov/physicalactivity/index.html  DIET: Good nutrition starts with a healthy diet of fruits, vegetables, whole grains, and lean protein sources. Drink plenty of water for hydration. Minimize empty calories, sodium, sweets. For more information about dietary recommendations visit: https://health.gov/our-work/nutrition-physical-activity/dietary-guidelines and https://www.myplate.gov/  ALCOHOL:  Women should limit their alcohol intake to no more than 7 drinks/beers/glasses of wine (combined, not each!) per week. Moderation of alcohol intake to this level decreases your risk of breast cancer and liver damage.  If you are concerned that you may have a problem, or your friends have told you they are concerned  about your drinking, there are many resources to help. A well-known program that is free, effective, and available to all people all over the nation is Alcoholics Anonymous.  Check out this site to learn more: https://www.aa.org/   CALCIUM AND VITAMIN D:  Adequate intake of calcium and Vitamin D are recommended for bone health.  You should be getting between 1000-1200 mg of calcium and 800 units of Vitamin D daily between diet and supplements  PAP SMEARS:  Pap smears, to check for cervical cancer or precancers,  have traditionally been done yearly, scientific advances have shown that most women can have pap smears less often.  However, every woman still should have a physical exam from her gynecologist every year. It will include a breast check, inspection of the vulva and vagina to check for abnormal growths or skin changes, a visual exam of the cervix, and then an exam to evaluate the size and shape of the uterus and ovaries. We will also provide age appropriate advice regarding health maintenance, like when you should have certain vaccines, screening for sexually transmitted diseases, bone density testing, colonoscopy, mammograms, etc.   MAMMOGRAMS:  All women over 40 years old should have a routine mammogram.   COLON CANCER SCREENING: Now recommend starting at age 45. At this time colonoscopy is not covered for routine screening until 50. There are take home tests that can be done between 45-49.   COLONOSCOPY:  Colonoscopy to screen for colon cancer is recommended for all women at age 50.  We know, you hate the idea of the prep.  We agree, BUT, having colon cancer and not knowing it is worse!!  Colon cancer so   often starts as a polyp that can be seen and removed at colonscopy, which can quite literally save your life!  And if your first colonoscopy is normal and you have no family history of colon cancer, most women don't have to have it again for 10 years.  Once every ten years, you can do something  that may end up saving your life, right?  We will be happy to help you get it scheduled when you are ready.  Be sure to check your insurance coverage so you understand how much it will cost.  It may be covered as a preventative service at no cost, but you should check your particular policy.      Breast Self-Awareness Breast self-awareness means being familiar with how your breasts look and feel. It involves checking your breasts regularly and reporting any changes to your health care provider. Practicing breast self-awareness is important. A change in your breasts can be a sign of a serious medical problem. Being familiar with how your breasts look and feel allows you to find any problems early, when treatment is more likely to be successful. All women should practice breast self-awareness, including women who have had breast implants. How to do a breast self-exam One way to learn what is normal for your breasts and whether your breasts are changing is to do a breast self-exam. To do a breast self-exam: Look for Changes  Remove all the clothing above your waist. Stand in front of a mirror in a room with good lighting. Put your hands on your hips. Push your hands firmly downward. Compare your breasts in the mirror. Look for differences between them (asymmetry), such as: Differences in shape. Differences in size. Puckers, dips, and bumps in one breast and not the other. Look at each breast for changes in your skin, such as: Redness. Scaly areas. Look for changes in your nipples, such as: Discharge. Bleeding. Dimpling. Redness. A change in position. Feel for Changes Carefully feel your breasts for lumps and changes. It is best to do this while lying on your back on the floor and again while sitting or standing in the shower or tub with soapy water on your skin. Feel each breast in the following way: Place the arm on the side of the breast you are examining above your head. Feel your  breast with the other hand. Start in the nipple area and make  inch (2 cm) overlapping circles to feel your breast. Use the pads of your three middle fingers to do this. Apply light pressure, then medium pressure, then firm pressure. The light pressure will allow you to feel the tissue closest to the skin. The medium pressure will allow you to feel the tissue that is a little deeper. The firm pressure will allow you to feel the tissue close to the ribs. Continue the overlapping circles, moving downward over the breast until you feel your ribs below your breast. Move one finger-width toward the center of the body. Continue to use the  inch (2 cm) overlapping circles to feel your breast as you move slowly up toward your collarbone. Continue the up and down exam using all three pressures until you reach your armpit.  Write Down What You Find  Write down what is normal for each breast and any changes that you find. Keep a written record with breast changes or normal findings for each breast. By writing this information down, you do not need to depend only on memory for size, tenderness,   or location. Write down where you are in your menstrual cycle, if you are still menstruating. If you are having trouble noticing differences in your breasts, do not get discouraged. With time you will become more familiar with the variations in your breasts and more comfortable with the exam. How often should I examine my breasts? Examine your breasts every month. If you are breastfeeding, the best time to examine your breasts is after a feeding or after using a breast pump. If you menstruate, the best time to examine your breasts is 5-7 days after your period is over. During your period, your breasts are lumpier, and it may be more difficult to notice changes. When should I see my health care provider? See your health care provider if you notice: A change in shape or size of your breasts or nipples. A change in the skin  of your breast or nipples, such as a reddened or scaly area. Unusual discharge from your nipples. A lump or thick area that was not there before. Pain in your breasts. Anything that concerns you.  

## 2021-06-02 NOTE — Telephone Encounter (Signed)
Please send in Imvexxy , 1 tablet vaginally 2 x a week, #24, 3 refills. This is not generic, so may also be expensive. Other options are the 10 mcg generic estradiol tablet and estrace cream. Please let her know.

## 2021-06-02 NOTE — Telephone Encounter (Signed)
Per note on Rx"Pharmacy comment: Alternative Requested:NOT ON FORMULARY, PLEASE SEND ALTERNATIVE."

## 2021-06-03 NOTE — Telephone Encounter (Signed)
Patient informed she would prefer the generic 10 mcg estradiol vaginal tablet because it comes in generic. Same directions #24 1 tablet vaginally twice weekly with 3 refills?

## 2021-06-03 NOTE — Telephone Encounter (Signed)
Yes, I can't seem to take the estring order out. Please replace it with estradiol 10 mcg tablets, 1 tablet vaginally 2 x a week at hs. #24, 3 refills.

## 2021-06-03 NOTE — Telephone Encounter (Signed)
Left message for patient to call.

## 2021-06-06 NOTE — Telephone Encounter (Signed)
Rx sent 

## 2021-10-20 ENCOUNTER — Other Ambulatory Visit: Payer: Self-pay | Admitting: Obstetrics and Gynecology

## 2021-10-20 DIAGNOSIS — N762 Acute vulvitis: Secondary | ICD-10-CM

## 2021-10-20 NOTE — Telephone Encounter (Signed)
Please let the patient know that I sent the script for her, but she should be seen if she continues to have ongoing issues.

## 2021-10-20 NOTE — Telephone Encounter (Signed)
Medication refill request: Valisone Last AEX:  06-01-21 JJ  Next AEX: 06-07-22 Last MMG (if hormonal medication request): n/a Refill authorized: Today, please advise.   Medication pended for #30g, 0RF. Please refill if appropriate.

## 2021-10-20 NOTE — Telephone Encounter (Signed)
Left detailed message on patient cell per DPR access.

## 2022-01-30 ENCOUNTER — Encounter: Payer: Self-pay | Admitting: Obstetrics and Gynecology

## 2022-05-15 DIAGNOSIS — M8580 Other specified disorders of bone density and structure, unspecified site: Secondary | ICD-10-CM

## 2022-05-15 HISTORY — DX: Other specified disorders of bone density and structure, unspecified site: M85.80

## 2022-06-01 NOTE — Progress Notes (Deleted)
62 y.o. [id number] Married White or Caucasian Not Hispanic or Latino female here for annual exam.      Patient's last menstrual period was 11/12/2008 (approximate).          Sexually active: {yes no:314532}  The current method of family planning is {contraception:315051}.    Exercising: {yes no:314532}  {types:19826} Smoker:  {YES NO:22349}  Health Maintenance: Pap: 10/18/2017 WNL NEG HPV, 06-18-14 Neg:Neg HR HPV  History of abnormal Pap:  yes many years ago. The repeat was normal  MMG:  01/30/22 Bi-rads 1 neg  BMD:   01/18/21 Osteopenia  T score -1.5 improved in left hip, otherwise stable. FRAX 7.4/0.7 Colonoscopy: 2014, f/u 10 years  TDaP:  05/2010  Gardasil: none   reports that she has never smoked. She has never used smokeless tobacco. She reports current alcohol use of about 4.0 standard drinks of alcohol per week. She reports that she does not use drugs.  Past Medical History:  Diagnosis Date   Migraine headache with aura    Seizure disorder [facility])     Past Surgical History:  Procedure Laterality Date   CESAREAN SECTION  1990   WISDOM TOOTH EXTRACTION  age 63    Current Outpatient Medications  Medication Sig Dispense Refill   betamethasone valerate ointment (VALISONE) 0.1 % APPLY TO AFFECTED AREA TWICE A DAY 30 g 0   botulinum toxin Type A (BOTOX) 100 units SOLR injection Botox 100 unit injection     cholecalciferol (VITAMIN D) 1000 units tablet Take 1,000 Units by mouth daily.     Estradiol 10 MCG TABS vaginal tablet Insert 1 tablet vaginally twice a week at bedtime. 24 tablet 3   gabapentin (NEURONTIN) 600 MG tablet Take 1.5 tablets by mouth daily.  3   Multiple Vitamins-Minerals (HAIR SKIN AND NAILS FORMULA) TABS Take by mouth.     No current facility-administered medications for this visit.    Family History  Problem Relation Age of Onset   Hypertension Mother    Hypertension Father     Review of Systems  Exam:   LMP 11/12/2008 (Approximate)   Weight change:  '@WEIGHTCHANGE'$ @ Height:      Ht Readings from Last 3 Encounters:  06/01/21 '5\' 2"'$  (1.575 m)  08/19/20 '5\' 2"'$  (1.575 m)  05/05/20 '5\' 2"'$  (1.575 m)    General appearance: alert, cooperative and appears stated age Head: Normocephalic, without obvious abnormality, atraumatic Neck: no adenopathy, supple, symmetrical, trachea midline and thyroid {CHL AMB PHY EX THYROID NORM [phone]::"normal to inspection and palpation"} Lungs: clear to auscultation bilaterally Cardiovascular: regular rate and rhythm Breasts: {Exam; breast:13139::"normal appearance, no masses or tenderness"} Abdomen: soft, non-tender; non distended,  no masses,  no organomegaly Extremities: extremities normal, atraumatic, no cyanosis or edema Skin: Skin color, texture, turgor normal. No rashes or lesions Lymph nodes: Cervical, supraclavicular, and axillary nodes normal. No abnormal inguinal nodes palpated Neurologic: Grossly normal   Pelvic: External genitalia:  no lesions              Urethra:  normal appearing urethra with no masses, tenderness or lesions              Bartholins and Skenes: normal                 Vagina: normal appearing vagina with normal color and discharge, no lesions              Cervix: {CHL AMB PHY EX CERVIX NORM [phone]::"no lesions"}  Bimanual Exam:  Uterus:  {CHL AMB PHY EX UTERUS NORM DEFAULT:[phone]::"normal size, contour, position, consistency, mobility, non-tender"}              Adnexa: {CHL AMB PHY EX ADNEXA NO MASS [phone]::"no mass, fullness, tenderness"}               Rectovaginal: Confirms               Anus:  normal sphincter tone, no lesions  *** chaperoned for the exam.  A:  Well Woman with normal exam  P:

## 2022-06-07 ENCOUNTER — Ambulatory Visit: Payer: No Typology Code available for payment source | Admitting: Obstetrics and Gynecology

## 2022-07-20 NOTE — Progress Notes (Signed)
62 y.o. [id number] Married White or Caucasian Not Hispanic or Latino female here for annual exam. She hasn't been using the vaginal estrogen tablets, feels that she gets headaches after using the tablets. She is sexually active, uses uberlube, but still painful.   Patient states that she needs a refill on her betamethasone ointment. She has intermittent flares of vulvar irritation, uses the steroid for a day or two and her symptoms improve. No current symptoms.   No vaginal bleeding. No bowel or bladder issues.     Patient's last menstrual period was 11/12/2008 (approximate).          Sexually active: Yes.    The current method of family planning is post menopausal status.    Exercising: Yes.     Walking  Smoker:  no  Health Maintenance: Pap:   10/18/2017 WNL NEG HPV, 06-18-14 Neg:Neg HR HPV   History of abnormal Pap:  yes many years ago. The repeat was normal   MMG:  01/30/22 Bi-rads 1 neg  BMD:  9/22 DEXA T score -1.5 improved in left hip, otherwise stable. FRAX 7.4/0.7 Colonoscopy: 10/04/12 f/u 10 years  TDaP:05/23/2010  she is aware it is due.  Gardasil: n/a   reports that she has never smoked. She has never used smokeless tobacco. She reports current alcohol use of about 4.0 standard drinks of alcohol per week. She reports that she does not use drugs. 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 Grown son, one grandson   Past Medical History:  Diagnosis Date   Migraine headache with aura    Seizure disorder [facility])     Past Surgical History:  Procedure Laterality Date   CESAREAN SECTION  1990   WISDOM TOOTH EXTRACTION  age 42    Current Outpatient Medications  Medication Sig Dispense Refill   betamethasone valerate ointment (VALISONE) 0.1 % APPLY TO AFFECTED AREA TWICE A DAY 30 g 0   botulinum toxin Type A (BOTOX) 100 units SOLR injection Botox 100 unit injection     cholecalciferol (VITAMIN D) 1000 units tablet Take 1,000 Units by mouth daily.     Estradiol 10 MCG TABS vaginal tablet  Insert 1 tablet vaginally twice a week at bedtime. 24 tablet 3   gabapentin (NEURONTIN) 600 MG tablet Take 1.5 tablets by mouth daily.  3   Multiple Vitamins-Minerals (HAIR SKIN AND NAILS FORMULA) TABS Take by mouth.     No current facility-administered medications for this visit.    Family History  Problem Relation Age of Onset   Hypertension Mother    Hypertension Father     Review of Systems  All other systems reviewed and are negative.   Exam:   LMP 11/12/2008 (Approximate)   Weight change: @WEIGHTCHANGE @ Height:      Ht Readings from Last 3 Encounters:  06/01/21 5\' 2"  (1.575 m)  08/19/20 5\' 2"  (1.575 m)  05/05/20 5\' 2"  (1.575 m)    General appearance: alert, cooperative and appears stated age Head: Normocephalic, without obvious abnormality, atraumatic Neck: no adenopathy, supple, symmetrical, trachea midline and thyroid normal to inspection and palpation Lungs: clear to auscultation bilaterally Cardiovascular: regular rate and rhythm Breasts: normal appearance, no masses or tenderness Abdomen: soft, non-tender; non distended,  no masses,  no organomegaly Extremities: extremities normal, atraumatic, no cyanosis or edema Skin: Skin color, texture, turgor normal. No rashes or lesions Lymph nodes: Cervical, supraclavicular, and axillary nodes normal. No abnormal inguinal nodes palpated Neurologic: Grossly normal   Pelvic: External genitalia:  no  lesions, mild erythema              Urethra:  normal appearing urethra with no masses, tenderness or lesions              Bartholins and Skenes: normal                 Vagina: atrophic appearing vagina with normal color and discharge, no lesions              Cervix: no lesions and stenotic               Bimanual Exam:  Uterus:  normal size, contour, position, consistency, mobility, non-tender and anteverted              Adnexa: no mass, fullness, tenderness               Rectovaginal: Confirms               Anus:  normal  sphincter tone, no lesions  Gae Dry, CMA chaperoned for the exam.  1. Well woman exam Discussed breast self exam Discussed calcium and vit D intake Mammogram due in 9/24  2. Screening for cervical cancer - Cytology - PAP  3. Colon cancer screening - Ambulatory referral to Gastroenterology, due in 5/24  4. Vaginal atrophy She gets headaches with the estrogen tablets - estradiol (ESTRING) 7.5 MCG/24HR vaginal ring; Place 1 each vaginally every 3 (three) months.  Dispense: 1 each; Refill: 3  5. Dyspareunia, female Start estring, continue with use of ubelube  6. Osteopenia, unspecified location DEXA due in 9/24, will send script to [facility]  7. Immunization due - Tdap vaccine greater than or equal to 7yo IM  8. Vulvar irritation Intermittent, discussed vulvar skin care. No current c/o - betamethasone valerate ointment (VALISONE) 0.1 %; APPLY TO AFFECTED AREA TWICE A DAY for up to 2 weeks as needed  Dispense: 30 g; Refill: 0  9. History of vitamin D deficiency Taking vit d - VITAMIN D 25 Hydroxy (Vit-D Deficiency, Fractures)

## 2022-08-01 ENCOUNTER — Ambulatory Visit (INDEPENDENT_AMBULATORY_CARE_PROVIDER_SITE_OTHER): Payer: No Typology Code available for payment source | Admitting: Obstetrics and Gynecology

## 2022-08-01 ENCOUNTER — Other Ambulatory Visit (HOSPITAL_COMMUNITY)
Admission: RE | Admit: 2022-08-01 | Discharge: 2022-08-01 | Disposition: A | Discharge status: Home / Self Care | Payer: No Typology Code available for payment source | Source: Ambulatory Visit | Attending: Obstetrics and Gynecology | Admitting: Obstetrics and Gynecology

## 2022-08-01 ENCOUNTER — Other Ambulatory Visit: Payer: Self-pay | Admitting: Obstetrics and Gynecology

## 2022-08-01 ENCOUNTER — Encounter: Payer: Self-pay | Admitting: Obstetrics and Gynecology

## 2022-08-01 VITALS — BP 110/64 | HR 78 | Ht 62.0 in | Wt 128.0 lb

## 2022-08-01 DIAGNOSIS — Z23 Encounter for immunization: Secondary | ICD-10-CM

## 2022-08-01 DIAGNOSIS — Z8639 Personal history of other endocrine, nutritional and metabolic disease: Secondary | ICD-10-CM

## 2022-08-01 DIAGNOSIS — N9089 Other specified noninflammatory disorders of vulva and perineum: Secondary | ICD-10-CM

## 2022-08-01 DIAGNOSIS — Z1211 Encounter for screening for malignant neoplasm of colon: Secondary | ICD-10-CM

## 2022-08-01 DIAGNOSIS — N952 Postmenopausal atrophic vaginitis: Secondary | ICD-10-CM

## 2022-08-01 DIAGNOSIS — M8580 Other specified disorders of bone density and structure, unspecified site: Secondary | ICD-10-CM

## 2022-08-01 DIAGNOSIS — Z124 Encounter for screening for malignant neoplasm of cervix: Secondary | ICD-10-CM | POA: Diagnosis present

## 2022-08-01 DIAGNOSIS — Z01419 Encounter for gynecological examination (general) (routine) without abnormal findings: Secondary | ICD-10-CM | POA: Diagnosis not present

## 2022-08-01 DIAGNOSIS — K602 Anal fissure, unspecified: Secondary | ICD-10-CM | POA: Insufficient documentation

## 2022-08-01 DIAGNOSIS — K625 Hemorrhage of anus and rectum: Secondary | ICD-10-CM | POA: Insufficient documentation

## 2022-08-01 DIAGNOSIS — N941 Unspecified dyspareunia: Secondary | ICD-10-CM | POA: Diagnosis not present

## 2022-08-01 MED ORDER — BETAMETHASONE VALERATE 0.1 % EX OINT: TOPICAL_OINTMENT | CUTANEOUS | 0 refills | Status: AC

## 2022-08-01 MED ORDER — ESTRADIOL 7.5 MCG/24HR VA RING: 1.0000 | VAGINAL_RING | VAGINAL | 3 refills | Status: DC

## 2022-08-01 NOTE — Patient Instructions (Addendum)
Call [facility] to schedule your bone density and mammogram in 9/24  You are due for a colonoscopy with Dr Collene Mares in 5/24  EXERCISE   We recommended that you start or continue a regular exercise program for good health. Physical activity is anything that gets your body moving, some is better than none. The CDC recommends 150 minutes per week of Moderate-Intensity Aerobic Activity and 2 or more days of Muscle Strengthening Activity.  Benefits of exercise are limitless: helps weight loss/weight maintenance, improves mood and energy, helps with depression and anxiety, improves sleep, tones and strengthens muscles, improves balance, improves bone density, protects from chronic conditions such as heart disease, high blood pressure and diabetes and so much more. To learn more visit: WhyNotPoker.uy  DIET: Good nutrition starts with a healthy diet of fruits, vegetables, whole grains, and lean protein sources. Drink plenty of water for hydration. Minimize empty calories, sodium, sweets. For more information about dietary recommendations visit: GeekRegister.com.ee and http://schaefer-mitchell.com/  ALCOHOL:  Women should limit their alcohol intake to no more than 7 drinks/beers/glasses of wine (combined, not each!) per week. Moderation of alcohol intake to this level decreases your risk of breast cancer and liver damage.  If you are concerned that you may have a problem, or your friends have told you they are concerned about your drinking, there are many resources to help. A well-known program that is free, effective, and available to all people all over the nation is Alcoholics Anonymous.  Check out this site to learn more: BlockTaxes.se   CALCIUM AND VITAMIN D:  Adequate intake of calcium and Vitamin D are recommended for bone health.  You should be getting between 1000-1200 mg of calcium and 800 units of Vitamin D daily between  diet and supplements  PAP SMEARS:  Pap smears, to check for cervical cancer or precancers,  have traditionally been done yearly, scientific advances have shown that most women can have pap smears less often.  However, every woman still should have a physical exam from her gynecologist every year. It will include a breast check, inspection of the vulva and vagina to check for abnormal growths or skin changes, a visual exam of the cervix, and then an exam to evaluate the size and shape of the uterus and ovaries. We will also provide age appropriate advice regarding health maintenance, like when you should have certain vaccines, screening for sexually transmitted diseases, bone density testing, colonoscopy, mammograms, etc.   MAMMOGRAMS:  All women over 47 years old should have a routine mammogram.   COLON CANCER SCREENING: Now recommend starting at age 72. At this time colonoscopy is not covered for routine screening until 50. There are take home tests that can be done between 45-49.   COLONOSCOPY:  Colonoscopy to screen for colon cancer is recommended for all women at age 52.  We know, you hate the idea of the prep.  We agree, BUT, having colon cancer and not knowing it is worse!!  Colon cancer so often starts as a polyp that can be seen and removed at colonscopy, which can quite literally save your life!  And if your first colonoscopy is normal and you have no family history of colon cancer, most women don't have to have it again for 10 years.  Once every ten years, you can do something that may end up saving your life, right?  We will be happy to help you get it scheduled when you are ready.  Be sure to check your insurance coverage  so you understand how much it will cost.  It may be covered as a preventative service at no cost, but you should check your particular policy.      Breast Self-Awareness Breast self-awareness means being familiar with how your breasts look and feel. It involves checking  your breasts regularly and reporting any changes to your health care provider. Practicing breast self-awareness is important. A change in your breasts can be a sign of a serious medical problem. Being familiar with how your breasts look and feel allows you to find any problems early, when treatment is more likely to be successful. All women should practice breast self-awareness, including women who have had breast implants. How to do a breast self-exam One way to learn what is normal for your breasts and whether your breasts are changing is to do a breast self-exam. To do a breast self-exam: Look for Changes  Remove all the clothing above your waist. Stand in front of a mirror in a room with good lighting. Put your hands on your hips. Push your hands firmly downward. Compare your breasts in the mirror. Look for differences between them (asymmetry), such as: Differences in shape. Differences in size. Puckers, dips, and bumps in one breast and not the other. Look at each breast for changes in your skin, such as: Redness. Scaly areas. Look for changes in your nipples, such as: Discharge. Bleeding. Dimpling. Redness. A change in position. Feel for Changes Carefully feel your breasts for lumps and changes. It is best to do this while lying on your back on the floor and again while sitting or standing in the shower or tub with soapy water on your skin. Feel each breast in the following way: Place the arm on the side of the breast you are examining above your head. Feel your breast with the other hand. Start in the nipple area and make  inch (2 cm) overlapping circles to feel your breast. Use the pads of your three middle fingers to do this. Apply light pressure, then medium pressure, then firm pressure. The light pressure will allow you to feel the tissue closest to the skin. The medium pressure will allow you to feel the tissue that is a little deeper. The firm pressure will allow you to feel  the tissue close to the ribs. Continue the overlapping circles, moving downward over the breast until you feel your ribs below your breast. Move one finger-width toward the center of the body. Continue to use the  inch (2 cm) overlapping circles to feel your breast as you move slowly up toward your collarbone. Continue the up and down exam using all three pressures until you reach your armpit.  Write Down What You Find  Write down what is normal for each breast and any changes that you find. Keep a written record with breast changes or normal findings for each breast. By writing this information down, you do not need to depend only on memory for size, tenderness, or location. Write down where you are in your menstrual cycle, if you are still menstruating. If you are having trouble noticing differences in your breasts, do not get discouraged. With time you will become more familiar with the variations in your breasts and more comfortable with the exam. How often should I examine my breasts? Examine your breasts every month. If you are breastfeeding, the best time to examine your breasts is after a feeding or after using a breast pump. If you menstruate, the best time  to examine your breasts is 5-7 days after your period is over. During your period, your breasts are lumpier, and it may be more difficult to notice changes. When should I see my health care provider? See your health care provider if you notice: A change in shape or size of your breasts or nipples. A change in the skin of your breast or nipples, such as a reddened or scaly area. Unusual discharge from your nipples. A lump or thick area that was not there before. Pain in your breasts. Anything that concerns you.

## 2022-08-01 NOTE — Telephone Encounter (Signed)
Medication refill request: estring 7.5  Last AEX:  08/01/22 Next AEX: none  Last MMG (if hormonal medication request): 01/30/22 normal  Refill authorized: request is a duplicate

## 2022-08-02 LAB — VITAMIN D 25 HYDROXY (VIT D DEFICIENCY, FRACTURES): Vit D, 25-Hydroxy: 33 ng/mL (ref 30–100)

## 2022-08-03 LAB — CYTOLOGY - PAP
Comment: NEGATIVE
Diagnosis: NEGATIVE
High risk HPV: NEGATIVE

## 2022-08-04 ENCOUNTER — Telehealth: Payer: Self-pay

## 2022-08-04 ENCOUNTER — Other Ambulatory Visit: Payer: Self-pay | Admitting: Obstetrics and Gynecology

## 2022-08-04 DIAGNOSIS — N952 Postmenopausal atrophic vaginitis: Secondary | ICD-10-CM

## 2022-08-04 NOTE — Telephone Encounter (Signed)
Communicated with pt via phone about normal results and routine f/u. Pt voiced understanding.

## 2022-08-07 NOTE — Telephone Encounter (Signed)
Rx request received from [facility] for Estring.  Rx sent on 08/01/22, #1/3RF Rx not covered by plan, OOP cost $600  Spoke with patient. Reviewed patient Hx, has tried and failed estradiol vaginal cream, estradiol 10 mcg vag tab and estradiol 2mg  vag ring. Will proceed with submitting PA for Estring 7.5 mcg vag ring through [email or web address]  Advised will notify once PA response received, advised this can take up to business hours and does not guarantee approval.   Patient verbalizes understanding and is agreeable.   [place] (Key: [id number]) PA Case ID #: [id number]

## 2022-08-08 NOTE — Telephone Encounter (Signed)
There are no other options other than 2 x a week dosing with estrogen creams or tablets. Did she try the good rx coupon?

## 2022-08-08 NOTE — Telephone Encounter (Signed)
PA Denied for Estring on 08/07/22.  Formulary alternative(s) are estradiol vaginal cream, Imvexxy, Vagifem.    Spoke with patient. Advised as seen above. Has tried estradiol vaginal cream and vagifem. Would like Dr. Talbert Nan to review and advise on alternative. Advised I will review with provider and f/u.    Dr. Talbert Nan -please review and advise.

## 2022-08-09 NOTE — Telephone Encounter (Signed)
Spoke with patient, advised per Dr. Talbert Nan.  Patient is going to use the Estring manufacturer savings card. Process reviewed. Patient is aware to return call if any any additional assistance is needed.   Routing to provider for final review. Patient is agreeable to disposition. Will close encounter.

## 2023-02-13 ENCOUNTER — Encounter: Payer: Self-pay | Admitting: Obstetrics and Gynecology

## 2023-02-13 ENCOUNTER — Encounter: Payer: Self-pay | Admitting: Nurse Practitioner

## 2023-07-20 LAB — HM COLONOSCOPY

## 2023-08-14 ENCOUNTER — Encounter: Payer: Self-pay | Admitting: Obstetrics and Gynecology

## 2023-08-14 ENCOUNTER — Ambulatory Visit (INDEPENDENT_AMBULATORY_CARE_PROVIDER_SITE_OTHER): Payer: No Typology Code available for payment source | Admitting: Obstetrics and Gynecology

## 2023-08-14 VITALS — BP 112/70 | HR 76 | Ht 61.5 in | Wt 128.0 lb

## 2023-08-14 DIAGNOSIS — Z1331 Encounter for screening for depression: Secondary | ICD-10-CM

## 2023-08-14 DIAGNOSIS — N952 Postmenopausal atrophic vaginitis: Secondary | ICD-10-CM | POA: Diagnosis not present

## 2023-08-14 DIAGNOSIS — Z01419 Encounter for gynecological examination (general) (routine) without abnormal findings: Secondary | ICD-10-CM

## 2023-08-14 MED ORDER — ALENDRONATE SODIUM 70 MG PO TABS: 70.0000 mg | ORAL_TABLET | ORAL | 11 refills | Status: AC

## 2023-08-14 MED ORDER — INTRAROSA 6.5 MG VA INST: 1.0000 | VAGINAL_INSERT | Freq: Every evening | VAGINAL | 12 refills | Status: AC | PRN

## 2023-08-14 NOTE — Progress Notes (Signed)
 63 y.o. y.o. female here for annual exam. Patient's last menstrual period was 11/12/2008 (approximate).    [id number] Married White or Caucasian Not Hispanic or Latino female here for annual exam. She hasn't been using the vaginal estrogen tablets, feels that she gets headaches after using the tablets. She is sexually active, uses uberlube, but still painful.   Patient states that she needs a refill on her betamethasone ointment. She has intermittent flares of vulvar irritation, uses the steroid for a day or two and her symptoms improve. No current symptoms.   No vaginal bleeding. No bowel or bladder issues.     Patient's last menstrual period was 11/12/2008 (approximate).          Sexually active: Yes.    The current method of family planning is post menopausal status.    Exercising: Yes.    Walking  Smoker:  no  Health Maintenance: Pap:   10/18/2017 WNL NEG HPV, 06-18-14 Neg:Neg HR HPV  Neg 2024 repeat q 3-5 years. Can stop at 66. History of abnormal Pap:  yes many years ago. The repeat was normal   MMG:  02/09/23 Bi-rads 1 neg  BMD:  9/22 DEXA T score -1.5 improved in left hip, otherwise stable. FRAX 7.4/0.7, 2024 ap spine -2.1 Frax 8.7, 1% to begin fosamax 08/14/23 due to dramatic loss in a short period.   In the past had a severe episode of reflux that was relieved with nexium.  She will let us know if she has any side effects.  Counseled on evenity/prolia. Repeat bone scan in one year.  Colonoscopy: 10/04/12 f/u 10 years done 07/20/23 TDaP:05/23/2010  she is aware it is due.  Gardasil: n/a Body mass index is 23.79 kg/m.  Past Medical History:  Diagnosis Date   Migraine headache with aura    Osteopenia 2024   Seizure disorder (HCC)         08/14/2023    8:02 AM  Depression screen PHQ 2/9  Decreased Interest 0  Down, Depressed, Hopeless 0  PHQ - 2 Score 0    Blood pressure 112/70, pulse 76, height 5' 1.5" (1.562 m), weight 128 lb (58.1 kg), last menstrual period 11/12/2008.      Component Value Date/Time   DIAGPAP  08/01/2022 0823    - Negative for intraepithelial lesion or malignancy (NILM)   DIAGPAP  10/18/2017 0000    NEGATIVE FOR INTRAEPITHELIAL LESIONS OR MALIGNANCY.   HPVHIGH Negative 08/01/2022 0823   ADEQPAP  08/01/2022 0823    Satisfactory for evaluation. The presence or absence of an   ADEQPAP  08/01/2022 0823    endocervical/transformation zone component cannot be determined because   ADEQPAP of atrophy. 08/01/2022 0823    GYN HISTORY:    Component Value Date/Time   DIAGPAP  08/01/2022 4540    - Negative for intraepithelial lesion or malignancy (NILM)   DIAGPAP  10/18/2017 0000    NEGATIVE FOR INTRAEPITHELIAL LESIONS OR MALIGNANCY.   HPVHIGH Negative 08/01/2022 0823   ADEQPAP  08/01/2022 0823    Satisfactory for evaluation. The presence or absence of an   ADEQPAP  08/01/2022 0823    endocervical/transformation zone component cannot be determined because   ADEQPAP of atrophy. 08/01/2022 0823    OB History  Gravida Para Term Preterm AB Living  3 1 1  0 2 1  SAB IAB Ectopic Multiple Live Births  1 1 0 0 1    # Outcome Date GA Lbr Len/2nd Weight Sex Type Anes PTL  Lv  3 Term 09/1988    M CS-Unspec   LIV  2 IAB           1 SAB             Past Medical History:  Diagnosis Date   Migraine headache with aura    Osteopenia 2024   Seizure disorder [facility])     Past Surgical History:  Procedure Laterality Date   CESAREAN SECTION  1990   WISDOM TOOTH EXTRACTION  age 47    Current Outpatient Medications on File Prior to Visit  Medication Sig Dispense Refill   betamethasone valerate ointment (VALISONE) 0.1 % APPLY TO AFFECTED AREA TWICE A DAY for up to 2 weeks as needed 30 g 0   botulinum toxin Type A (BOTOX) 100 units SOLR injection Botox 100 unit injection     cholecalciferol (VITAMIN D) 1000 units tablet Take 1,000 Units by mouth daily.     gabapentin (NEURONTIN) 600 MG tablet Take 1.5 tablets by mouth daily.  3   Multiple  Vitamins-Minerals (HAIR SKIN AND NAILS FORMULA) TABS Take by mouth.     No current facility-administered medications on file prior to visit.    Social History   Socioeconomic History   Marital status: Married    Spouse name: Not on file   Number of children: Not on file   Years of education: Not on file   Highest education level: Not on file  Occupational History   Not on file  Tobacco Use   Smoking status: Never   Smokeless tobacco: Never  Vaping Use   Vaping status: Never Used  Substance and Sexual Activity   Alcohol use: Yes    Alcohol/week: 4.0 standard drinks of alcohol    Types: 4 Glasses of wine per week   Drug use: No   Sexual activity: Yes    Partners: Male    Birth control/protection: Post-menopausal  Other Topics Concern   Not on file  Social History Narrative   Not on file   Social Drivers of Health   Financial Resource Strain: Not on file  Food Insecurity: Not on file  Transportation Needs: Not on file  Physical Activity: Not on file  Stress: Not on file  Social Connections: Not on file  Intimate Partner Violence: Not on file    Family History  Problem Relation Age of Onset   Hypertension Mother    Hypertension Father      Allergies  Allergen Reactions   Codeine Nausea And Vomiting   Past Medical History:  Diagnosis Date   Migraine headache with aura    Osteopenia 2024   Seizure disorder (HCC)       Patient's last menstrual period was Patient's last menstrual period was 11/12/2008 (approximate)..            Review of Systems Alls systems reviewed and are negative.     Physical Exam Constitutional:      Appearance: Normal appearance.  Genitourinary:     Vulva and urethral meatus normal.     No lesions in the vagina.     Right Labia: No rash, lesions or skin changes.    Left Labia: No lesions, skin changes or rash.    No vaginal discharge or tenderness.     No vaginal prolapse present.    Moderate vaginal atrophy present.      Right Adnexa: not tender, not palpable and no mass present.    Left Adnexa: not tender, not palpable and  no mass present.    No cervical motion tenderness or discharge.     Uterus is not enlarged, tender or irregular.  Breasts:    Right: Normal.     Left: Normal.  HENT:     Head: Normocephalic.  Neck:     Thyroid: No thyroid mass, thyromegaly or thyroid tenderness.  Cardiovascular:     Rate and Rhythm: Normal rate and regular rhythm.     Heart sounds: Normal heart sounds, S1 normal and S2 normal.  Pulmonary:     Effort: Pulmonary effort is normal.     Breath sounds: Normal breath sounds and air entry.  Abdominal:     General: There is no distension.     Palpations: Abdomen is soft. There is no mass.     Tenderness: There is no abdominal tenderness. There is no guarding or rebound.  Musculoskeletal:        General: Normal range of motion.     Cervical back: Full passive range of motion without pain, normal range of motion and neck supple. No tenderness.     Right lower leg: No edema.     Left lower leg: No edema.  Neurological:     Mental Status: She is alert.  Skin:    General: Skin is warm.  Psychiatric:        Mood and Affect: Mood normal.        Behavior: Behavior normal.        Thought Content: Thought content normal.  Vitals and nursing note reviewed. Exam conducted with a chaperone present.       A:         Well Woman GYN exam, atrophic vaginitis                             P:        Pap smear not indicated Encouraged annual mammogram screening Colon cancer screening up-to-date DXA up-to-date repeat in one year with the recent start of fosamax. And rapid decline between 2 years with no medications. She was encouraged to let us know if fosamax is causing side effects Labs and immunizations to do with PMD Intrarosa sent for vaginal dryness and decreased libido. Failed other options.  Encouraged to find the online coupon as well. Encouraged healthy lifestyle  practices Encouraged Vit D and Calcium   No follow-ups on file.  Earley Favor

## 2023-08-15 ENCOUNTER — Other Ambulatory Visit: Payer: Self-pay

## 2023-08-15 DIAGNOSIS — N9089 Other specified noninflammatory disorders of vulva and perineum: Secondary | ICD-10-CM

## 2023-08-16 ENCOUNTER — Telehealth: Payer: Self-pay | Admitting: *Deleted

## 2023-08-16 NOTE — Telephone Encounter (Signed)
Insurance information submitted to [employer] portal. Will await summary of benefits for prolia.    

## 2023-08-16 NOTE — Telephone Encounter (Signed)
-----   Message from Earley Favor sent at 08/14/2023  8:43 AM EDT ----- Put a message into Betty York,  She took a significant dive towards osteoporisis in 2 years, but not quite there with a normal frax. She is open to medication to help.  2022 -1.5 2024 -2.1   She has [place] insurance No fractures besides Wrist as a teen and a toe fracture  Can we run a PA for evenity or prolia.  I am going to try a course of fosamax but she had an episode of severe reflux before that resolved with nexium in th past without being on the medication.  Dr. Karma Greaser

## 2023-08-27 NOTE — Telephone Encounter (Signed)
 PA for prolia submitted via [employer] portal. Authorization number: [id number]. Will await response.

## 2023-08-28 NOTE — Telephone Encounter (Signed)
 [employer] requesting OV notes and BMD report. Clinical records faxed to [employer] at [phone].

## 2023-09-04 NOTE — Telephone Encounter (Signed)
 Message from [employer] stating coverage for prolia has ben denied for the requested services because we have not been able to obtain any requested clinical information from the provider to determine whether or not the services are considered medically necessary under the terms of the plan.   Will initiate appeal letter for prolia.

## 2023-09-04 NOTE — Telephone Encounter (Signed)
 Letter of appeal, OV notes, snapshot, 2022 BMD and 2024 BMD faxed to [facility] Claims Department at [phone]. Will await response.

## 2023-09-25 ENCOUNTER — Ambulatory Visit: Admitting: Obstetrics and Gynecology

## 2023-09-25 ENCOUNTER — Encounter: Payer: Self-pay | Admitting: Obstetrics and Gynecology

## 2023-09-25 ENCOUNTER — Other Ambulatory Visit (HOSPITAL_COMMUNITY)
Admission: RE | Admit: 2023-09-25 | Discharge: 2023-09-25 | Disposition: A | Discharge status: Home / Self Care | Source: Ambulatory Visit | Attending: Obstetrics and Gynecology | Admitting: Obstetrics and Gynecology

## 2023-09-25 VITALS — BP 114/70 | HR 79

## 2023-09-25 DIAGNOSIS — L819 Disorder of pigmentation, unspecified: Secondary | ICD-10-CM | POA: Diagnosis present

## 2023-09-25 DIAGNOSIS — N9089 Other specified noninflammatory disorders of vulva and perineum: Secondary | ICD-10-CM

## 2023-09-25 MED ORDER — ESTRADIOL 0.1 MG/GM VA CREA: 1.0000 | TOPICAL_CREAM | Freq: Every day | VAGINAL | 12 refills | Status: AC

## 2023-09-25 NOTE — Progress Notes (Signed)
   Acute Office Visit  Subjective:    Patient ID: Betty York, female    DOB: [birth year], 62 y.o.   MRN: [id number]   HPI 63 y.o. presents today for vulvar biopsy (Vulvary biopsy//jj) .  Patient's last menstrual period was 11/12/2008 (approximate).    Review of Systems    Procedure: Punch biopsy Patient consented for the procedure with written consent. Time out performed.  The area was cleaned with betadine.  1 cc of lidocaine  with epi was injected subcutaneously.  A 3 mm punch biopsy was used and the biopsy was then lifted and cut with the scissors and sent to pathology.  Hemostasis was achieved with silver nitrate.  Patient tolerated the procedure well.  Post care instructions were discussed with patient.    Objective:     OBGyn Exam  BP 114/70   Pulse 79   LMP 11/12/2008 (Approximate)   SpO2 98%  Wt Readings from Last 3 Encounters:  08/14/23 128 lb (58.1 kg)  08/01/22 128 lb (58.1 kg)  06/01/21 129 lb (58.5 kg)    Betty York was present during entire exam and procedure  Assessment & Plan:  Hypopigmented area at introitus with read areas b/l down to anus Left introitus punch biopsy  performed. To apply neosporin twice daily along with estrogen cream. Concerning s/s were reviewed and discussed with patient and encouraged her to return with any concerns. To also use warm soapy wash clothes to the area twice daily and observe with a mirror daily.   Reinaldo Caras

## 2023-09-25 NOTE — Addendum Note (Signed)
 Addended by: Reinaldo Caras on: 09/25/2023 05:06 PM   Modules accepted: Orders

## 2023-09-26 NOTE — Addendum Note (Signed)
 Addended by: Reinaldo Caras on: 09/26/2023 09:59 AM   Modules accepted: Orders

## 2023-10-02 ENCOUNTER — Ambulatory Visit: Payer: Self-pay | Admitting: Obstetrics and Gynecology

## 2023-10-02 LAB — SURGICAL PATHOLOGY

## 2023-10-12 NOTE — Telephone Encounter (Signed)
 Decision letter on Appeal received from [facility] stating, "after reviewing the above information, we are standing by our earlier decision to uphold the denial of coverage for [id number], prolia."  Routing to provider to review and advise.

## 2023-10-12 NOTE — Telephone Encounter (Signed)
 Or self pay for prolia with co pay card?

## 2023-10-18 NOTE — Telephone Encounter (Signed)
 Insurance would still have to approve the authorization or she would not be able to use the co pay card.

## 2023-10-22 NOTE — Telephone Encounter (Signed)
 Detailed message left for patient per DPR. Advising patient of message as seen below from Dr. Tia Flowers. Advised prescription for Fosamax  sent to pharmacy on 08/14/23 and to return call to office if not tolerating Fosamax  or any additional questions.   Encounter closed.

## 2023-11-15 ENCOUNTER — Encounter: Payer: Self-pay | Admitting: Obstetrics and Gynecology

## 2024-05-13 LAB — HM MAMMOGRAPHY

## 2024-05-14 ENCOUNTER — Encounter: Payer: Self-pay | Admitting: Nurse Practitioner

## 2024-08-19 ENCOUNTER — Ambulatory Visit: Admitting: Obstetrics and Gynecology
